# Patient Record
Sex: Female | Born: 1965 | Race: Black or African American | Hispanic: No | Marital: Single | Smoking: Never smoker
Health system: Southern US, Community
[De-identification: ages and names within clinical notes are randomized; demographics above are authoritative.]

## PROBLEM LIST (undated history)

## (undated) DIAGNOSIS — J45909 Unspecified asthma, uncomplicated: Secondary | ICD-10-CM

## (undated) DIAGNOSIS — M1990 Unspecified osteoarthritis, unspecified site: Secondary | ICD-10-CM

## (undated) DIAGNOSIS — E119 Type 2 diabetes mellitus without complications: Secondary | ICD-10-CM

## (undated) DIAGNOSIS — K219 Gastro-esophageal reflux disease without esophagitis: Secondary | ICD-10-CM

## (undated) DIAGNOSIS — T1491XA Suicide attempt, initial encounter: Secondary | ICD-10-CM

## (undated) DIAGNOSIS — I10 Essential (primary) hypertension: Secondary | ICD-10-CM

## (undated) HISTORY — PX: CHOLECYSTECTOMY: SHX55

---

## 1998-07-24 ENCOUNTER — Ambulatory Visit: Admission: RE | Admit: 1998-07-24 | Discharge: 1998-07-24 | Payer: Self-pay

## 1999-11-27 ENCOUNTER — Encounter: Admission: RE | Admit: 1999-11-27 | Discharge: 1999-11-27 | Payer: Self-pay | Admitting: Internal Medicine

## 2000-01-12 ENCOUNTER — Encounter: Admission: RE | Admit: 2000-01-12 | Discharge: 2000-01-12 | Payer: Self-pay | Admitting: Internal Medicine

## 2000-02-12 ENCOUNTER — Encounter: Admission: RE | Admit: 2000-02-12 | Discharge: 2000-05-12 | Payer: Self-pay | Admitting: Internal Medicine

## 2000-03-28 ENCOUNTER — Encounter: Admission: RE | Admit: 2000-03-28 | Discharge: 2000-03-28 | Payer: Self-pay | Admitting: Hematology and Oncology

## 2000-04-05 ENCOUNTER — Encounter: Admission: RE | Admit: 2000-04-05 | Discharge: 2000-04-05 | Payer: Self-pay | Admitting: Internal Medicine

## 2000-08-08 ENCOUNTER — Encounter: Admission: RE | Admit: 2000-08-08 | Discharge: 2000-08-08 | Payer: Self-pay | Admitting: Hematology and Oncology

## 2001-01-16 ENCOUNTER — Encounter: Admission: RE | Admit: 2001-01-16 | Discharge: 2001-01-16 | Payer: Self-pay | Admitting: Hematology and Oncology

## 2001-02-13 ENCOUNTER — Ambulatory Visit (HOSPITAL_COMMUNITY): Admission: RE | Admit: 2001-02-13 | Discharge: 2001-02-13 | Payer: Self-pay | Admitting: Internal Medicine

## 2001-02-13 ENCOUNTER — Encounter: Admission: RE | Admit: 2001-02-13 | Discharge: 2001-02-13 | Payer: Self-pay | Admitting: Internal Medicine

## 2001-02-25 ENCOUNTER — Encounter: Admission: RE | Admit: 2001-02-25 | Discharge: 2001-02-25 | Payer: Self-pay | Admitting: Obstetrics & Gynecology

## 2001-02-25 ENCOUNTER — Other Ambulatory Visit: Admission: RE | Admit: 2001-02-25 | Discharge: 2001-02-25 | Payer: Self-pay | Admitting: Obstetrics & Gynecology

## 2001-05-23 ENCOUNTER — Encounter: Admission: RE | Admit: 2001-05-23 | Discharge: 2001-05-23 | Payer: Self-pay | Admitting: Internal Medicine

## 2001-08-06 ENCOUNTER — Encounter: Admission: RE | Admit: 2001-08-06 | Discharge: 2001-08-06 | Payer: Self-pay | Admitting: Internal Medicine

## 2001-09-11 ENCOUNTER — Encounter: Admission: RE | Admit: 2001-09-11 | Discharge: 2001-09-11 | Payer: Self-pay

## 2002-04-21 ENCOUNTER — Encounter: Admission: RE | Admit: 2002-04-21 | Discharge: 2002-04-21 | Payer: Self-pay | Admitting: Internal Medicine

## 2002-11-16 ENCOUNTER — Encounter: Admission: RE | Admit: 2002-11-16 | Discharge: 2002-11-16 | Payer: Self-pay | Admitting: Internal Medicine

## 2003-03-26 ENCOUNTER — Encounter: Admission: RE | Admit: 2003-03-26 | Discharge: 2003-03-26 | Payer: Self-pay | Admitting: Internal Medicine

## 2003-03-30 ENCOUNTER — Encounter: Admission: RE | Admit: 2003-03-30 | Discharge: 2003-03-30 | Payer: Self-pay | Admitting: Internal Medicine

## 2003-05-14 ENCOUNTER — Emergency Department (HOSPITAL_COMMUNITY): Admission: AD | Admit: 2003-05-14 | Discharge: 2003-05-15 | Payer: Self-pay

## 2008-01-19 ENCOUNTER — Inpatient Hospital Stay (HOSPITAL_COMMUNITY): Admission: EM | Admit: 2008-01-19 | Discharge: 2008-01-28 | Payer: Self-pay | Admitting: Emergency Medicine

## 2008-01-22 ENCOUNTER — Encounter (INDEPENDENT_AMBULATORY_CARE_PROVIDER_SITE_OTHER): Payer: Self-pay | Admitting: Orthopedic Surgery

## 2011-01-23 NOTE — Op Note (Signed)
NAMECECILI, [name]               ACCOUNT NO.:  [id number]   MEDICAL RECORD NO.:  [id number]          PATIENT TYPE:  INP   LOCATION:  5122                         FACILITY:  [facility]   PHYSICIAN:  Metta Clines. Supple, M.D.  DATE OF BIRTH:  1966-01-15   DATE OF PROCEDURE:  DATE OF DISCHARGE:                               OPERATIVE REPORT   PREOPERATIVE DIAGNOSES:  Left midfoot abscess with fifth metatarsal  osteomyelitis.   POSTOPERATIVE DIAGNOSES:  1. Left midfoot abscess with fifth metatarsal osteomyelitis.  2. Proximal extension of abscess along the peroneal tendon sheath into      the lower leg.   PROCEDURE:  1. Left foot incision and drainage with extension proximally into the      lower leg laterally decompressing the peroneal tendon sheath.  2. Fifth ray amputation.   SURGEON:  Metta Clines. Supple, MD   ASSISTANT:  Reather Laurence. Shuford, PA-C   ANESTHESIA:  LMA general.   TOURNIQUET TIME:  None was used.   ESTIMATED BLOOD LOSS:  300 mL.   SPECIMENS:  Fluid was obtained from the midfoot abscess and sent for  routine culture and sensitivity, both aerobic and anaerobic; also sent  from the more proximal abscess, which was subcutaneous in the lateral  aspect of the lower leg, again aerobic and anaerobic.  We also sent a  specimen of the fifth metatarsal for culture and then the remaining  amputated part was sent for routine pathology.   HISTORY:  [name] is a 45 year old insulin-dependent diabetic who has  been followed for a nonhealing wound in her left foot and underwent an  initial I&D by podiatrist down in [facility] and had a VAC wound closure  system applied.  She is seen by her primary physician down in [place]  and due to deterioration of the wound, evidence for purulent  accumulation and foul-smelling discharge, she was subsequently referred  to the [facility] Emergency Room, where she was admitted by the  [facility].  Her foot was found to be severely swollen  and  erythematous with continued purulent drainage.  The patient became  septic and orthopedic consultation was obtained.  She was found to have  a markedly swollen erythematous foot with a transverse ulceration over  the midfoot laterally, and more proximally a sinus tract with frank  purulent drainage, and extremely foul smell.  Compression of the ankle  caused abundant purulent material to exit through the wound distally.  [name] is brought to the operating at this time for treatment of her  acute subcutaneous abscess, which is apparent to progress proximally,  and now is becoming limb threatening.   Preoperatively, I counseled [name] on treatment options as well as  risks versus benefits thereof.  Possible surgical complications  bleeding, infection, neurovascular injury, persistent pain, and possible  need for additional surgery including potential for amputation at a  higher level were all reviewed.  She understands, accepts, and agrees.   PROCEDURE IN DETAIL:  After undergoing routine preop evaluation, the  patient was brought to the operating room, placed supine on  operating  table, and underwent smooth induction of an LMA general anesthesia.  The  left lower extremity was then sterilely prepped and draped in the  midcalf distally.  The initial ulceration over the lateral aspect of the  midfoot was approximately 3 x 6 cm, then there is a more proximal sinus  tract.  A lateral incision was then made longitudinally following the  course of the fifth metatarsal towards the base of the fifth toe, and  initially extending to the level of the lateral malleolus.  Sharp  dissection carried deeply and this did encounter a large subcutaneous  abscess, which dissected both dorsally as well as plantarly.  Sharp  dissection was used to debride the necrotic soft tissue at the margins  of the previously noted ulcer as well as around the sinus tract.  The  depth of the sinus tract was  also completely excised.  Digital  exploration was used to open up the abscess cavities, and we did find  there was abundant purulent material descending from more proximally.  With the finding of purulence into the lateral aspect of the ankle,  incision was then carried proximally over the fibula, and then  posteriorly along the course of the peroneal musculature, and dissection  carried deeply into the subcutaneous tissue.  Following the course of  peroneal tendon sheath, we did encounter a purulent material at this  level, and this was completely debrided.  All loculations were broken up  and digital exploration was used to confirm that there were no more  proximal accumulations of purulent material.  We aggressively milked the  calf to make sure there is no further purulent material more proximally.  We completely explored all the compartments about the lateral ankle.  We  then dissected deeply.  The previous MRI scan and radiographs had showed  evidence for significant bony overgrowth and deformity of the fifth  metatarsal, which was felt to likely represent chronic osteomyelitis.  With these findings, we proceeded with a fifth ray amputation.  Sharp  dissection was used to divide the fifth metatarsal from adjacent fourth  metatarsal on the inner metatarsal ligaments, and dissected the more  proximal tendinous attachments, and also then performed an elliptical  excision of the fifth toe.  This allowed exposure of healthy-appearing  tissue surrounding the amputated ray.  This wound was then meticulously  cleaned and we used pulsatile lavage to irrigate and debride the entire  wound.  We then used a combination of pressure and electrocautery to  obtain hemostasis.  Of note, there was a brisk bleeding from all cut  surfaces.  We obtained final hemostasis and then did a wound packing  with a moist Kerlix for the wound, wrapped with ABDs, 4x4s, dry Kerlix,  and an Ace bandage, creating a  pressure dressing.  The patient was then  extubated and taken to the recovery room in stable condition.      Metta Clines. Supple, M.D.  Electronically Signed     KMS/MEDQ  D:  01/22/2008  T:  01/23/2008  Job:  [id number]

## 2011-01-23 NOTE — H&P (Signed)
NAMEBAILLEY, [name] NO.:  [id number]   MEDICAL RECORD NO.:  [id number]           PATIENT TYPE:   LOCATION:                                 FACILITY:   PHYSICIAN:  [name], M.D. DATE OF BIRTH:  [birth year]   DATE OF ADMISSION:  01/19/2008  DATE OF DISCHARGE:                              HISTORY & PHYSICAL   The patient primary care doctor is unassigned.   CHIEF COMPLAINTS:  Left foot swelling.   HISTORY OF PRESENT ILLNESS:  [name] is a 86-year female with past  medical history of diabetes mellitus who states that she developed a  left foot wound on the plantar surface with subsequent I&D back in December 11, 2007.  A wound V.A.C. was placed.  She has been receiving IV  vancomycin and has a visiting nurse.  Her visiting nurse came out today  and stated that her left leg appeared to be hot and swollen.  Her  primary care doctor was called, and she was told to come to the hospital  for evaluation.  The patient indicates that she has chronic pain in that  leg, and there has been no new changes with regards to the pain level.  She also complains of some subjective fevers and chills but no nausea or  vomiting.   PAST MEDICAL HISTORY:  1. Diabetes mellitus.  2. Asthma.  3. Hypothyroidism.  4. Depression.  5. Anxiety.  6. Neuropathy.   PAST SURGICAL HISTORY:  Left plantar wound I&D January 03, 2008.   ALLERGIES:  No known drug allergies.   HOME MEDICATIONS:  1. Lantus insulin 65 units b.i.d.  2. Apidra 35 units t.i.d. before each meal.  3. Synthroid 10 mg daily.  4. Cymbalta 60 mg daily.  5. Lisinopril 20 mg daily.   SOCIAL HISTORY:  Cigarettes:  The patient denies.  Alcohol:  The patient  denies.   FAMILY HISTORY:  Mother has diabetes.  Father also diabetes.   REVIEW OF SYSTEMS:  As per HPI.   PHYSICAL EXAMINATION:  GENERAL:  The patient is awake.  She is in no  obvious respiratory distress.  VITALS:  Temperature is 98.1, blood pressure  96/58, heart rate 96,  respirations 20, O2 saturation 97%.  HEENT:  Normocephalic, atraumatic.  Anicteric.  Extraocular movements  are intact.  Oral mucosa is pink.  No thrush.  No exudates.  NECK:  Supple.  No lymphadenopathy.  No thyromegaly.  CARDIAC:  S1 and S2 present.  Regular rate and rhythm.  RESPIRATORY:  No crackles or wheezes.  ABDOMEN:  Soft, nontender, nondistended.  Positive bowel sounds x4  quadrants.  No masses palpated.  EXTREMITIES:  The left foot has a large wound on the plantar aspect of  it.  There is a wound V.A.C. attached to the wound.  NEUROLOGICAL:  The patient is alert and oriented x3.  MUSCULOSKELETAL:  5/5 upper and lower extremity strength.   Sodium is 133, potassium 3.5, chloride 99, CO2 24, glucose 135, BUN 10,  creatinine 1.01, calcium 8.6.  White blood cell count 16.6, hemoglobin  7.5, hematocrit  22.4, platelet count 371.  X-rays the left foot reveals  a deformity of the left fifth metatarsal with sclerosis in the fourth  and fifth metatarsals likely chronic osteomyelitis, overlying soft  tissue swelling and soft tissue defect.   ASSESSMENT:  1. Left plantar foot wound with infection.  Will check blood cultures      x2 as well as wound cultures.  Will start the patient on      antibiotics.  Results of the x-ray have already been viewed.  It      revealed probable chronic osteomyelitis.  Will consider ordering an      MRI of the patient's foot to rule out any acute sign of      osteomyelitis.  We will also attempt to obtain prior medical      records.  Will consult the wound care nurse for further evaluation      and consider consultation with orthopedic surgery.  2. History of diabetes mellitus.  Will resume the patient's prior      diabetic medications as well as Accu-Chek  3. Anemia.  Source of the patient's anemia is questionable.  Will      check stools as well as iron studies.  4. History of hypothyroidism.  Will resume the patient's  Synthroid.  5. Gastrointestinal prophylaxis, will provide Protonix.  6. Deep vein thrombosis prophylaxis, will provide Lovenox      [name], M.D.  Electronically Signed     OR/MEDQ  D:  01/19/2008  T:  01/19/2008  Job:  [id number]

## 2011-01-23 NOTE — Discharge Summary (Signed)
NAMEBERTHINE, Smith               ACCOUNT NO.:  [id number]   MEDICAL RECORD NO.:  [id number]          PATIENT TYPE:  INP   LOCATION:  5122                         FACILITY:  [facility]   PHYSICIAN:  Cherene Altes, M.D.DATE OF BIRTH:  1965/10/31   DATE OF ADMISSION:  01/19/2008  DATE OF DISCHARGE:  01/28/2008                               DISCHARGE SUMMARY   PRIMARY CARE PHYSICIAN:  Unassigned - physician in [place].   DISCHARGE DIAGNOSES:  1. Severe polymicrobial left foot wound.      a.     Status post I and D x2 during her hospital stay.      b.     Ongoing outpatient VAC therapy to be continued.      c.     High probability of recurrent wound infection.      d.     Possible below-the-knee amputation to be required if wound       does not improve significantly.      e.     Ongoing outpatient IV antibiotic therapy.      f.     Outpatient wound followup established in the patient's home       town and home wound care established in home town.  2. Diabetes mellitus.  3. Morbid obesity.  4. Chronic kidney disease with baseline creatinine approximately 1.9-      2.9.  5. Hypothyroidism.  6. Anemia - combination chronic kidney disease plus chronic      inflammation from wound.  7. Staphylococcus aureus bacteremia - methicillin susceptible -      completed course of IV vancomycin.  8. Depression.  9. Anxiety disorder.  10.Severe diabetic neuropathy bilateral lower extremities.   DISCHARGE MEDICATIONS:  1. Invanz 1 gram IV per PICC line daily until discontinued by the      patient's primary care physician.  2. Lantus insulin 16 units subcu b.i.d.  3. Glipizide/metformin 5/500 two tablets p.o. b.i.d.  4. Clonazepam 1 mg b.i.d.  5. Lisinopril 10 mg p.o. daily.  6. Levothyroxine 150 mcg p.o. daily.  7. Cymbalta 60 mg p.o. b.i.d.   CONSULTATIONS:  Dr. Justice Britain with orthopedic surgery.   PROCEDURES:  1. MRI of lower extremity Jan 20, 2008, mid shaft fracture fifth  metatarsal with chronic healing response and abnormal edema and      enhancement in the fifth metatarsal compatible with osteomyelitis.      Considerable surrounding soft tissue edema is present.  Low level      enhancement lateral cuboid and calcaneus may also represent      osteomyelitis.  Cortical thinning in the base of the fourth      metatarsal could be a manifestation of osteomyelitis although no      overt edema in the shaft of the fourth metatarsal was identified.      Needle shaped metallic foreign body is present along the medial      corner of the plantar fascia with thickening of the plantar fascia      just distal to this foreign body.  Extensive muscular and  subcutaneous edema in the foot compatible with myositis and      cellulitis.  Considerable degenerative tarsometatarsal spurring and      degenerative arthropathy.  2. Left foot incision and drainage with extension proximally in the      left lateral into the lower leg laterally decompressing the      peroneal tendon sheath.  Fifth ray amputation.  3. Repeat I and D.   FOLLOWUP:  The patient is well established with followup in .  She is advised to continue to follow up as follows:  A.  The patient is advised to follow up with her podiatrist who was  previously following her foot wound on Friday of this week.  B.  The patient is advised to continue with her home health wound VAC  therapy nurse to attend to her wound care needs.  Arrangements are being  made for this by social worker prior to the patient's discharge.  C.  Home IV antibiotics via the PICC line are advised to be continued.  D.  The patient is advised to follow up with her primary care physician  within 3-4 days to assure that her CBG is extremely well controlled.   HOSPITAL COURSE:  [name] is a very pleasant 45 year old  morbidly obese uncontrolled diabetic who had been dealing with a left  foot wound for quite some time in the  outpatient setting.  This had been  managed at home in her home town through what appears to have been of a  podiatry consultation as well as home VAC therapy with IV antibiotics.  Unfortunately the patient's wound was not progressing well and after  visiting with home health nurse the patient was advised to present to  [facility] emergency room for evaluation.  The patient was admitted to  acute unit.  Cultures were sent.  MRI was obtained and did suggest the  probability of cellulitis.  Orthopedic consultation was carried out.  Empiric antibiotics were administered.  No group A strep or Staph aureus  were able to be isolated from the wound itself.  Incidentally, Staph  aureus which was essentially pansensitive was localized within the  patient's blood on a solitary blood culture from Jan 19, 2008.  The  patient was treated for this with ongoing vancomycin therapy.  Otherwise, rare Proteus mirabilis was the only pathogen actually  identified on the patient's wound cultures.  This was felt to most  likely be secondary to the fact that the patient's wound was  polymicrobial.  This was primarily pansensitive with the exception to  trimethoprim sulfamethoxazole as well as ampicillin.  Actually, on those  blood cultures it was 1 out of 2 total drawn.  With ongoing IV  antibiotic therapy the patient was taken to the operating room for an  incision and drainage.  Initially the patient underwent a fifth ray  amputation.  After reinspecting the wound the patient was taken back to  the OR for a second I and D.  Wound VAC was then applied.  Adjustments  were made in the patient's diabetic regimen to assure that her CBG  remained well controlled.  Blood was transfused due to the surgical loss  and the baseline of chronic kidney disease.  The patient otherwise  remained stable during her hospitalization.   On Jan 28, 2008, the patient was cleared medically for discharge home.  The wound is noted to  be quite extensive.  At the present time there is  granulation on the edges of the wound but the base of the wound tracked  back deep into the plantar fascia area onto the plantar aspect of the  foot.  There is significant concern that this wound could spread and  that the patient could ultimately require a below-the-knee amputation.  The serious nature of this wound is conveyed directly to the patient.  The absolute need for very close care is conveyed to the patient.  The  patient assures this dictator that she had arrangements for home health  VAC and IV antibiotic therapy.  She also further  assures this dictator that she has a podiatrist who will continue to  evaluate her foot in the outpatient setting.  With these arrangements in  place the patient is therefore cleared for discharge on Jan 28, 2008, on  the above listed medical regimen.      Cherene Altes, M.D.  Electronically Signed     JTM/MEDQ  D:  01/28/2008  T:  01/28/2008  Job:  [id number]

## 2011-01-23 NOTE — Op Note (Signed)
[name], [name]               ACCOUNT NO.:  [id number]   MEDICAL RECORD NO.:  [id number]          PATIENT TYPE:  INP   LOCATION:  5122                         FACILITY:  [facility]   PHYSICIAN:  Metta Clines. Supple, M.D.  DATE OF BIRTH:  Jun 17, 1966   DATE OF PROCEDURE:  01/23/2008  DATE OF DISCHARGE:                               OPERATIVE REPORT   PREOPERATIVE DIAGNOSIS:  Left foot diabetic abscess.   POSTOPERATIVE DIAGNOSIS:  Left foot diabetic abscess.   PROCEDURES:  Exploration, irrigation, and debridement of the left foot  diabetic abscess.   SURGEON:  Metta Clines. Supple, M.D.   Terrence DupontOlivia Mackie A. Shuford, P.A.-C.   ANESTHESIA:  General endotracheal.   ESTIMATED BLOOD LOSS:  Minimal.   DRAINS:  None.   HISTORY:  [name] is a 45 year old female who was taken to the  operating room yesterday for urgent debridement of a left foot lateral  plantar diabetic abscess and was brought back to the operating room  today for repeat I&D with possible placement of a vacuum-assisted  closure device.   Preoperatively, counseled [name] of treatment options as well as  risks versus benefits thereof.  Possible surgical complications with  infection or vascular injury, as well as, likely need for additional  surgery, potential need for amputation are reviewed.  The patient  understands and accepts and agrees with the plan.   PROCEDURE IN DETAIL:  After adequate routine preop evaluation, the  patient was brought to the operating room and placed supine on the  operating table and underwent induction of the general endotracheal  anesthesia.  She is on an on-going course of antibiotics.  Dressings  were removed from the left leg and left lower extremity was then  sterilely prepped and draped in standard fashion.  The wound extends  from the base of the second toe to the ankle, then proximally up into  the calf, additional 20 cm.  The entire wound was debrided with a  rongeur of any  necrotic tissue regions.  We did find over the plantar  aspect of the midfoot laterally, there was a collection of purulent  material and necrotic tissue, and this area was sharply excised away and  then a rongeur was used to further debride this region.  In addition,  there appeared to be some nonviable-appearing tissue into the plantar  fat pad of the heel and these areas were all opened and debrided and  explored and subsequently irrigated.  We did not see any evidence for  purulence tracking proximally or on the dorsum of the foot.  At this  point, after the wound was debrided to our  satisfaction and all surfaces appeared viable, we then used pulsatile  lavage to meticulously clean the wound.  We placed a wet-to-dry dressing  with damp Kerlix packed in the wound and multiple ABDs, Kerlix, and Ace  wrap.  The patient was then awakened, extubated, and taken to the  recovery room in stable condition.      Metta Clines. Supple, M.D.  Electronically Signed     KMS/MEDQ  D:  01/23/2008  T:  01/24/2008  Job:  [id number]

## 2011-06-06 LAB — CBC
HCT: 26.7 — ABNORMAL LOW
HCT: 28.9 — ABNORMAL LOW
Hemoglobin: 10.2 — ABNORMAL LOW
Hemoglobin: 8.1 — ABNORMAL LOW
MCHC: 34.2
MCHC: 34.6
MCHC: 35
MCV: 78.9
MCV: 80.6
MCV: 81
MCV: 81.3
MCV: 81.7
Platelets: 426 — ABNORMAL HIGH
Platelets: 449 — ABNORMAL HIGH
Platelets: 453 — ABNORMAL HIGH
Platelets: 486 — ABNORMAL HIGH
RBC: 2.91 — ABNORMAL LOW
RBC: 3.36 — ABNORMAL LOW
RBC: 3.41 — ABNORMAL LOW
RBC: 3.64 — ABNORMAL LOW
RDW: 17.6 — ABNORMAL HIGH
RDW: 17.9 — ABNORMAL HIGH
RDW: 18.2 — ABNORMAL HIGH
RDW: 18.9 — ABNORMAL HIGH
WBC: 13.3 — ABNORMAL HIGH
WBC: 14.4 — ABNORMAL HIGH
WBC: 15 — ABNORMAL HIGH
WBC: 15.1 — ABNORMAL HIGH

## 2011-06-06 LAB — BASIC METABOLIC PANEL
BUN: 11
BUN: 11
BUN: 7
BUN: 9
CO2: 21
CO2: 21
Calcium: 8 — ABNORMAL LOW
Calcium: 8.4
Calcium: 8.6
Calcium: 8.6
Chloride: 102
Chloride: 106
Creatinine, Ser: 1.91 — ABNORMAL HIGH
Creatinine, Ser: 1.96 — ABNORMAL HIGH
Creatinine, Ser: 2.39 — ABNORMAL HIGH
GFR calc Af Amer: 34 — ABNORMAL LOW
GFR calc Af Amer: 35 — ABNORMAL LOW
GFR calc non Af Amer: 28 — ABNORMAL LOW
GFR calc non Af Amer: 28 — ABNORMAL LOW
Glucose, Bld: 121 — ABNORMAL HIGH
Glucose, Bld: 270 — ABNORMAL HIGH
Glucose, Bld: 69 — ABNORMAL LOW
Potassium: 4.8
Sodium: 138

## 2011-06-06 LAB — GLUCOSE, RANDOM: Glucose, Bld: 310 — ABNORMAL HIGH

## 2011-06-06 LAB — METHYLMALONIC ACID(MMA), RND URINE: Methylmalonic Acid, Ur: 0.7 umol/L

## 2011-06-06 LAB — VANCOMYCIN, TROUGH: Vancomycin Tr: 36.4

## 2011-06-06 LAB — VANCOMYCIN, RANDOM: Vancomycin Rm: 24.5

## 2016-04-01 DIAGNOSIS — E875 Hyperkalemia: Secondary | ICD-10-CM

## 2016-04-01 DIAGNOSIS — R079 Chest pain, unspecified: Secondary | ICD-10-CM

## 2016-04-01 DIAGNOSIS — E1169 Type 2 diabetes mellitus with other specified complication: Secondary | ICD-10-CM

## 2016-04-01 DIAGNOSIS — N390 Urinary tract infection, site not specified: Secondary | ICD-10-CM

## 2016-04-01 DIAGNOSIS — R45851 Suicidal ideations: Secondary | ICD-10-CM

## 2016-04-01 DIAGNOSIS — I160 Hypertensive urgency: Secondary | ICD-10-CM

## 2016-04-01 DIAGNOSIS — Z8709 Personal history of other diseases of the respiratory system: Secondary | ICD-10-CM

## 2016-04-01 DIAGNOSIS — N179 Acute kidney failure, unspecified: Secondary | ICD-10-CM

## 2016-04-04 DIAGNOSIS — N390 Urinary tract infection, site not specified: Secondary | ICD-10-CM

## 2016-04-04 DIAGNOSIS — E1169 Type 2 diabetes mellitus with other specified complication: Secondary | ICD-10-CM

## 2016-04-04 DIAGNOSIS — I160 Hypertensive urgency: Secondary | ICD-10-CM

## 2016-04-04 DIAGNOSIS — E875 Hyperkalemia: Secondary | ICD-10-CM

## 2016-04-04 DIAGNOSIS — R45851 Suicidal ideations: Secondary | ICD-10-CM

## 2016-04-04 DIAGNOSIS — R079 Chest pain, unspecified: Secondary | ICD-10-CM

## 2016-04-04 DIAGNOSIS — N179 Acute kidney failure, unspecified: Secondary | ICD-10-CM

## 2016-04-04 DIAGNOSIS — Z8709 Personal history of other diseases of the respiratory system: Secondary | ICD-10-CM

## 2016-04-05 DIAGNOSIS — I10 Essential (primary) hypertension: Secondary | ICD-10-CM

## 2016-04-11 ENCOUNTER — Encounter (HOSPITAL_COMMUNITY): Payer: Self-pay | Admitting: *Deleted

## 2016-04-11 ENCOUNTER — Inpatient Hospital Stay (HOSPITAL_COMMUNITY)
Admission: AD | Admit: 2016-04-11 | Discharge: 2016-04-25 | DRG: 683 | Disposition: A | Discharge status: Skilled Nursing Facility | Payer: Medicaid Other | Source: Other Acute Inpatient Hospital | Attending: Internal Medicine | Admitting: Internal Medicine

## 2016-04-11 DIAGNOSIS — E1122 Type 2 diabetes mellitus with diabetic chronic kidney disease: Secondary | ICD-10-CM | POA: Diagnosis present

## 2016-04-11 DIAGNOSIS — E872 Acidosis, unspecified: Secondary | ICD-10-CM

## 2016-04-11 DIAGNOSIS — Z833 Family history of diabetes mellitus: Secondary | ICD-10-CM

## 2016-04-11 DIAGNOSIS — Z89512 Acquired absence of left leg below knee: Secondary | ICD-10-CM | POA: Diagnosis not present

## 2016-04-11 DIAGNOSIS — Z9114 Patient's other noncompliance with medication regimen: Secondary | ICD-10-CM

## 2016-04-11 DIAGNOSIS — Z590 Homelessness: Secondary | ICD-10-CM

## 2016-04-11 DIAGNOSIS — N184 Chronic kidney disease, stage 4 (severe): Secondary | ICD-10-CM

## 2016-04-11 DIAGNOSIS — Z9119 Patient's noncompliance with other medical treatment and regimen: Secondary | ICD-10-CM

## 2016-04-11 DIAGNOSIS — N179 Acute kidney failure, unspecified: Secondary | ICD-10-CM | POA: Diagnosis not present

## 2016-04-11 DIAGNOSIS — F419 Anxiety disorder, unspecified: Secondary | ICD-10-CM | POA: Diagnosis present

## 2016-04-11 DIAGNOSIS — E1151 Type 2 diabetes mellitus with diabetic peripheral angiopathy without gangrene: Secondary | ICD-10-CM | POA: Diagnosis present

## 2016-04-11 DIAGNOSIS — N189 Chronic kidney disease, unspecified: Secondary | ICD-10-CM

## 2016-04-11 DIAGNOSIS — I10 Essential (primary) hypertension: Secondary | ICD-10-CM | POA: Diagnosis present

## 2016-04-11 DIAGNOSIS — I129 Hypertensive chronic kidney disease with stage 1 through stage 4 chronic kidney disease, or unspecified chronic kidney disease: Secondary | ICD-10-CM | POA: Diagnosis present

## 2016-04-11 DIAGNOSIS — E1140 Type 2 diabetes mellitus with diabetic neuropathy, unspecified: Secondary | ICD-10-CM | POA: Diagnosis present

## 2016-04-11 DIAGNOSIS — E875 Hyperkalemia: Secondary | ICD-10-CM

## 2016-04-11 DIAGNOSIS — Z515 Encounter for palliative care: Secondary | ICD-10-CM | POA: Diagnosis not present

## 2016-04-11 DIAGNOSIS — F319 Bipolar disorder, unspecified: Secondary | ICD-10-CM | POA: Diagnosis present

## 2016-04-11 DIAGNOSIS — R079 Chest pain, unspecified: Secondary | ICD-10-CM

## 2016-04-11 DIAGNOSIS — Z9049 Acquired absence of other specified parts of digestive tract: Secondary | ICD-10-CM

## 2016-04-11 DIAGNOSIS — E869 Volume depletion, unspecified: Secondary | ICD-10-CM | POA: Diagnosis present

## 2016-04-11 DIAGNOSIS — R45851 Suicidal ideations: Secondary | ICD-10-CM | POA: Diagnosis present

## 2016-04-11 DIAGNOSIS — F4325 Adjustment disorder with mixed disturbance of emotions and conduct: Secondary | ICD-10-CM | POA: Diagnosis not present

## 2016-04-11 DIAGNOSIS — Z8249 Family history of ischemic heart disease and other diseases of the circulatory system: Secondary | ICD-10-CM

## 2016-04-11 DIAGNOSIS — Z841 Family history of disorders of kidney and ureter: Secondary | ICD-10-CM | POA: Diagnosis not present

## 2016-04-11 DIAGNOSIS — E1169 Type 2 diabetes mellitus with other specified complication: Secondary | ICD-10-CM

## 2016-04-11 DIAGNOSIS — Z8709 Personal history of other diseases of the respiratory system: Secondary | ICD-10-CM

## 2016-04-11 DIAGNOSIS — G629 Polyneuropathy, unspecified: Secondary | ICD-10-CM

## 2016-04-11 DIAGNOSIS — N390 Urinary tract infection, site not specified: Secondary | ICD-10-CM

## 2016-04-11 DIAGNOSIS — I160 Hypertensive urgency: Secondary | ICD-10-CM | POA: Diagnosis not present

## 2016-04-11 DIAGNOSIS — F329 Major depressive disorder, single episode, unspecified: Secondary | ICD-10-CM | POA: Diagnosis present

## 2016-04-11 DIAGNOSIS — Z7189 Other specified counseling: Secondary | ICD-10-CM | POA: Diagnosis not present

## 2016-04-11 DIAGNOSIS — Z89511 Acquired absence of right leg below knee: Secondary | ICD-10-CM | POA: Diagnosis not present

## 2016-04-11 DIAGNOSIS — F32A Depression, unspecified: Secondary | ICD-10-CM | POA: Diagnosis present

## 2016-04-11 DIAGNOSIS — E876 Hypokalemia: Secondary | ICD-10-CM | POA: Diagnosis not present

## 2016-04-11 DIAGNOSIS — Z6841 Body Mass Index (BMI) 40.0 and over, adult: Secondary | ICD-10-CM | POA: Diagnosis not present

## 2016-04-11 DIAGNOSIS — R45850 Homicidal ideations: Secondary | ICD-10-CM

## 2016-04-11 DIAGNOSIS — K219 Gastro-esophageal reflux disease without esophagitis: Secondary | ICD-10-CM | POA: Diagnosis present

## 2016-04-11 HISTORY — DX: Essential (primary) hypertension: I10

## 2016-04-11 HISTORY — DX: Unspecified osteoarthritis, unspecified site: M19.90

## 2016-04-11 HISTORY — DX: Gastro-esophageal reflux disease without esophagitis: K21.9

## 2016-04-11 HISTORY — DX: Type 2 diabetes mellitus without complications: E11.9

## 2016-04-11 HISTORY — DX: Suicide attempt, initial encounter: T14.91XA

## 2016-04-11 HISTORY — DX: Unspecified asthma, uncomplicated: J45.909

## 2016-04-11 LAB — CBC
HCT: 27.2 % — ABNORMAL LOW (ref 36.0–46.0)
HEMOGLOBIN: 8.6 g/dL — AB (ref 12.0–15.0)
MCH: 26.2 pg (ref 26.0–34.0)
MCHC: 31.6 g/dL (ref 30.0–36.0)
MCV: 82.9 fL (ref 78.0–100.0)
Platelets: 317 10*3/uL (ref 150–400)
RBC: 3.28 MIL/uL — AB (ref 3.87–5.11)
RDW: 15.3 % (ref 11.5–15.5)
WBC: 6.6 10*3/uL (ref 4.0–10.5)

## 2016-04-11 LAB — GLUCOSE, CAPILLARY
GLUCOSE-CAPILLARY: 147 mg/dL — AB (ref 65–99)
Glucose-Capillary: 239 mg/dL — ABNORMAL HIGH (ref 65–99)

## 2016-04-11 LAB — MRSA PCR SCREENING: MRSA BY PCR: NEGATIVE

## 2016-04-11 LAB — BASIC METABOLIC PANEL
ANION GAP: 7 (ref 5–15)
BUN: 53 mg/dL — ABNORMAL HIGH (ref 6–20)
CHLORIDE: 106 mmol/L (ref 101–111)
CO2: 20 mmol/L — ABNORMAL LOW (ref 22–32)
Calcium: 8.3 mg/dL — ABNORMAL LOW (ref 8.9–10.3)
Creatinine, Ser: 3.68 mg/dL — ABNORMAL HIGH (ref 0.44–1.00)
GFR, EST AFRICAN AMERICAN: 16 mL/min — AB (ref >60)
GFR, EST NON AFRICAN AMERICAN: 13 mL/min — AB (ref >60)
Glucose, Bld: 184 mg/dL — ABNORMAL HIGH (ref 65–99)
POTASSIUM: 5.2 mmol/L — AB (ref 3.5–5.1)
SODIUM: 133 mmol/L — AB (ref 135–145)

## 2016-04-11 MED ORDER — SODIUM CHLORIDE 0.9% FLUSH
3.0000 mL | Freq: Two times a day (BID) | INTRAVENOUS | Status: DC
Start: 2016-04-11 — End: 2016-04-11

## 2016-04-11 MED ORDER — ALBUTEROL SULFATE (2.5 MG/3ML) 0.083% IN NEBU
2.5000 mg | INHALATION_SOLUTION | RESPIRATORY_TRACT | Status: DC | PRN
  Administered 2016-04-20: 2.5 mg via RESPIRATORY_TRACT
  Filled 2016-04-11: qty 3

## 2016-04-11 MED ORDER — QUETIAPINE FUMARATE 25 MG PO TABS
25.0000 mg | ORAL_TABLET | Freq: Three times a day (TID) | ORAL | Status: DC
  Administered 2016-04-11 – 2016-04-25 (×38): 25 mg via ORAL
  Filled 2016-04-11 (×40): qty 1

## 2016-04-11 MED ORDER — FLUTICASONE PROPIONATE 50 MCG/ACT NA SUSP
1.0000 | Freq: Two times a day (BID) | NASAL | Status: DC
  Administered 2016-04-11 – 2016-04-25 (×26): 1 via NASAL
  Filled 2016-04-11: qty 16

## 2016-04-11 MED ORDER — INSULIN DETEMIR 100 UNIT/ML ~~LOC~~ SOLN
10.0000 [IU] | Freq: Two times a day (BID) | SUBCUTANEOUS | Status: DC
  Administered 2016-04-11 – 2016-04-25 (×28): 10 [IU] via SUBCUTANEOUS
  Filled 2016-04-11 (×29): qty 0.1

## 2016-04-11 MED ORDER — AMLODIPINE BESYLATE 10 MG PO TABS: 10.0000 mg | ORAL_TABLET | Freq: Every day | ORAL | Status: DC

## 2016-04-11 MED ORDER — HYDRALAZINE HCL 20 MG/ML IJ SOLN: 10.0000 mg | Freq: Four times a day (QID) | INTRAMUSCULAR | Status: DC | PRN

## 2016-04-11 MED ORDER — HEPARIN SODIUM (PORCINE) 5000 UNIT/ML IJ SOLN
5000.0000 [IU] | Freq: Three times a day (TID) | INTRAMUSCULAR | Status: DC
  Administered 2016-04-11 – 2016-04-25 (×38): 5000 [IU] via SUBCUTANEOUS
  Filled 2016-04-11 (×39): qty 1

## 2016-04-11 MED ORDER — METOPROLOL TARTRATE 25 MG PO TABS
25.0000 mg | ORAL_TABLET | Freq: Two times a day (BID) | ORAL | Status: DC
Start: 2016-04-11 — End: 2016-04-25
  Administered 2016-04-11 – 2016-04-25 (×27): 25 mg via ORAL
  Filled 2016-04-11 (×28): qty 1

## 2016-04-11 MED ORDER — SODIUM CHLORIDE 0.9% FLUSH: 3.0000 mL | INTRAVENOUS | Status: DC | PRN

## 2016-04-11 MED ORDER — ONDANSETRON HCL 4 MG PO TABS: 4.0000 mg | ORAL_TABLET | Freq: Four times a day (QID) | ORAL | Status: DC | PRN

## 2016-04-11 MED ORDER — ALBUTEROL SULFATE (2.5 MG/3ML) 0.083% IN NEBU: 2.5000 mg | INHALATION_SOLUTION | RESPIRATORY_TRACT | Status: DC | PRN

## 2016-04-11 MED ORDER — HYDRALAZINE HCL 25 MG PO TABS
25.0000 mg | ORAL_TABLET | Freq: Three times a day (TID) | ORAL | Status: DC
  Administered 2016-04-11 – 2016-04-22 (×31): 25 mg via ORAL
  Filled 2016-04-11 (×32): qty 1

## 2016-04-11 MED ORDER — LORATADINE 10 MG PO TABS
10.0000 mg | ORAL_TABLET | Freq: Every day | ORAL | Status: DC
Start: 2016-04-11 — End: 2016-04-25
  Administered 2016-04-11 – 2016-04-25 (×15): 10 mg via ORAL
  Filled 2016-04-11 (×15): qty 1

## 2016-04-11 MED ORDER — ONDANSETRON HCL 4 MG/2ML IJ SOLN: 4.0000 mg | Freq: Four times a day (QID) | INTRAMUSCULAR | Status: DC | PRN

## 2016-04-11 MED ORDER — SODIUM CHLORIDE 0.9% FLUSH
3.0000 mL | Freq: Two times a day (BID) | INTRAVENOUS | Status: DC
  Administered 2016-04-11 – 2016-04-17 (×13): 3 mL via INTRAVENOUS

## 2016-04-11 MED ORDER — OXYCODONE HCL 5 MG PO TABS
5.0000 mg | ORAL_TABLET | ORAL | Status: DC | PRN
  Administered 2016-04-11 – 2016-04-12 (×2): 5 mg via ORAL
  Filled 2016-04-11 (×2): qty 1

## 2016-04-11 MED ORDER — ACETAMINOPHEN 325 MG PO TABS: 650.0000 mg | ORAL_TABLET | Freq: Four times a day (QID) | ORAL | Status: DC | PRN

## 2016-04-11 MED ORDER — ACETAMINOPHEN 325 MG PO TABS
650.0000 mg | ORAL_TABLET | Freq: Four times a day (QID) | ORAL | Status: DC | PRN
  Administered 2016-04-20 – 2016-04-22 (×3): 650 mg via ORAL
  Filled 2016-04-11 (×3): qty 2

## 2016-04-11 MED ORDER — ASPIRIN EC 81 MG PO TBEC
81.0000 mg | DELAYED_RELEASE_TABLET | Freq: Every day | ORAL | Status: DC
  Administered 2016-04-12 – 2016-04-25 (×14): 81 mg via ORAL
  Filled 2016-04-11 (×14): qty 1

## 2016-04-11 MED ORDER — SODIUM CHLORIDE 0.9 % IV SOLN: 250.0000 mL | INTRAVENOUS | Status: DC | PRN

## 2016-04-11 MED ORDER — INSULIN ASPART 100 UNIT/ML ~~LOC~~ SOLN
0.0000 [IU] | Freq: Three times a day (TID) | SUBCUTANEOUS | Status: DC
  Administered 2016-04-11: 5 [IU] via SUBCUTANEOUS
  Administered 2016-04-12 (×2): 2 [IU] via SUBCUTANEOUS
  Administered 2016-04-12 – 2016-04-13 (×2): 3 [IU] via SUBCUTANEOUS
  Administered 2016-04-13: 2 [IU] via SUBCUTANEOUS
  Administered 2016-04-13 – 2016-04-14 (×2): 3 [IU] via SUBCUTANEOUS
  Administered 2016-04-14: 5 [IU] via SUBCUTANEOUS
  Administered 2016-04-14: 2 [IU] via SUBCUTANEOUS
  Administered 2016-04-15 (×2): 3 [IU] via SUBCUTANEOUS
  Administered 2016-04-15: 2 [IU] via SUBCUTANEOUS
  Administered 2016-04-16 – 2016-04-17 (×4): 3 [IU] via SUBCUTANEOUS
  Administered 2016-04-17: 2 [IU] via SUBCUTANEOUS
  Administered 2016-04-17: 3 [IU] via SUBCUTANEOUS
  Administered 2016-04-18: 2 [IU] via SUBCUTANEOUS
  Administered 2016-04-18: 3 [IU] via SUBCUTANEOUS
  Administered 2016-04-19 – 2016-04-21 (×6): 2 [IU] via SUBCUTANEOUS
  Administered 2016-04-22 – 2016-04-23 (×2): 3 [IU] via SUBCUTANEOUS
  Administered 2016-04-24: 2 [IU] via SUBCUTANEOUS
  Administered 2016-04-24: 5 [IU] via SUBCUTANEOUS

## 2016-04-11 MED ORDER — PANTOPRAZOLE SODIUM 40 MG PO TBEC
40.0000 mg | DELAYED_RELEASE_TABLET | Freq: Every day | ORAL | Status: DC
Start: 2016-04-12 — End: 2016-04-25
  Administered 2016-04-12 – 2016-04-25 (×14): 40 mg via ORAL
  Filled 2016-04-11 (×14): qty 1

## 2016-04-11 MED ORDER — FLUOXETINE HCL 20 MG PO TABS
10.0000 mg | ORAL_TABLET | Freq: Every day | ORAL | Status: DC
  Filled 2016-04-11: qty 1

## 2016-04-11 MED ORDER — SODIUM POLYSTYRENE SULFONATE 15 GM/60ML PO SUSP
30.0000 g | Freq: Once | ORAL | Status: AC
  Administered 2016-04-11: 30 g via ORAL
  Filled 2016-04-11: qty 120

## 2016-04-11 MED ORDER — PREGABALIN 25 MG PO CAPS
25.0000 mg | ORAL_CAPSULE | Freq: Every day | ORAL | Status: DC
  Administered 2016-04-11 – 2016-04-24 (×13): 25 mg via ORAL
  Filled 2016-04-11 (×14): qty 1

## 2016-04-11 MED ORDER — ACETAMINOPHEN 650 MG RE SUPP: 650.0000 mg | Freq: Four times a day (QID) | RECTAL | Status: DC | PRN

## 2016-04-11 NOTE — H&P (Signed)
HISTORY AND PHYSICAL       PATIENT DETAILS Name: [name] Age: 50 y.o. Sex: female Date of Birth: [birth year] Admit Date: 04/11/2016 PCP:No primary care provider on file.   CHIEF COMPLAINT:  Transfer from [facility] was psychiatric evaluation  HPI: [name] is a 50 y.o. female with medical history significant of of type 2 diabetes, hypertension, bilateral BKA, and occasional noncompliance prior to this admission, depression/bipolar disorder with prior suicidal attempt who initially was admitted to [facility] on 04/01/16 for evaluation of chest pain during that visit she also claimed that she had suicidal ideation by overdosing on multiple medications. She was subsequently admitted to [facility], cardiac enzymes remained and negative. Cipro course was complicated by development of uncontrolled hypertension for which she was treated with multiple antihypertensives and a UTI for which she required intravenous antibiotics. She was followed by telemetry psychiatry at [facility], initially recommendations were to transfer to an inpatient psychiatric facility, however today psychiatry recommended that the patient be transferred to [facility] for face-to-face evaluation by a psychiatrist.   During my evaluation, patient denies any chest pain. She still acknowledges suicidal ideation. She also acknowledges that she was living with her daughter for the past 1 year in [place], [place], before that she was in a skilled nursing facility in [place], [place]. She unfortunately has no place to go upon discharge has not of her family members will be able to take her in.   REVIEW OF SYSTEMS:  Constitutional:   No  weight loss, night sweats,  Fevers, chills, fatigue.  HEENT:    No headaches, Dysphagia,Tooth/dental problems,Sore throat,  No sneezing, itching, ear ache, nasal congestion, post nasal drip  Cardio-vascular: No chest  pain,Orthopnea, PND,lower extremity edema, anasarca, palpitations  GI:  No heartburn, indigestion, abdominal pain, nausea, vomiting, diarrhea, melena or hematochezia  Resp: No shortness of breath, cough, hemoptysis,plueritic chest pain.   Skin:  No rash or lesions.  GU:  No dysuria, change in color of urine, no urgency or frequency.  No flank pain.  Musculoskeletal: No joint pain or swelling.  No decreased range of motion.  No back pain.  Endocrine: No heat intolerance, no cold intolerance, no polyuria, no polydipsia  Psych: No change in mood or affect. No depression or anxiety.  No memory loss.   ALLERGIES:  No Known Allergies  PAST MEDICAL HISTORY: Past Medical History:  Diagnosis Date  . Arthritis   . Asthma   . Diabetes mellitus without complication ([place])   . GERD (gastroesophageal reflux disease)   . Hypertension   . Suicide attempt [facility])     PAST SURGICAL HISTORY: Past Surgical History:  Procedure Laterality Date  . CHOLECYSTECTOMY      MEDICATIONS AT HOME: Prior to Admission medications   Medication Sig Start Date End Date Taking? Authorizing Provider  aspirin 81 MG chewable tablet Chew 81 mg by mouth daily.   Yes Historical Provider, MD    FAMILY HISTORY: Family History  Problem Relation Age of Onset  . Diabetes Mother   . Kidney failure Mother   . Diabetes Father   . Hypertension Father      SOCIAL HISTORY:  reports that she has never smoked. She has never used smokeless tobacco. She reports that she drinks alcohol. She reports that she does not use drugs.  PHYSICAL EXAM: Blood pressure (!) 134/91, pulse 72, temperature 98.9 F (37.2 C), temperature source Oral, resp. rate 18,  height 5\' 7"  (1.702 m), weight (!) 160.6 kg (354 lb), last menstrual period 04/11/2008, SpO2 98 %.  General appearance :Awake, alert, not in any distress. Speech Clear. Not toxic Looking. She is morbidly obese HEENT: Atraumatic and Normocephalic, pupils equally  reactive to light and accomodation Neck: supple, no JVD. No cervical lymphadenopathy.  Chest:Good air entry bilaterally, no added sounds  CVS: S1 S2 regular, no murmurs.  Abdomen: Bowel sounds present, Non tender and not distended with no gaurding, rigidity or rebound. Extremities: B/L BKA stump without any ulceration  Neurology:  Non focal Psychiatric: Normal judgment and insight. Alert and oriented x 3. Normal mood. Skin:No Rash Wounds:N/A  LABS ON ADMISSION:  I have personally reviewed following labs and imaging studies  CBC: No results for input(s): WBC, NEUTROABS, HGB, HCT, MCV, PLT in the last 168 hours.  Basic Metabolic Panel: No results for input(s): NA, K, CL, CO2, GLUCOSE, BUN, CREATININE, CALCIUM, MG, PHOS in the last 168 hours.  GFR: CrCl cannot be calculated (Patient's most recent lab result is older than the maximum 21 days allowed.).  Liver Function Tests: No results for input(s): AST, ALT, ALKPHOS, BILITOT, PROT, ALBUMIN in the last 168 hours. No results for input(s): LIPASE, AMYLASE in the last 168 hours. No results for input(s): AMMONIA in the last 168 hours.  Coagulation Profile: No results for input(s): INR, PROTIME in the last 168 hours.  Cardiac Enzymes: No results for input(s): CKTOTAL, CKMB, CKMBINDEX, TROPONINI in the last 168 hours.  BNP (last 3 results) No results for input(s): PROBNP in the last 8760 hours.  HbA1C: No results for input(s): HGBA1C in the last 72 hours.  CBG: No results for input(s): GLUCAP in the last 168 hours.  Lipid Profile: No results for input(s): CHOL, HDL, LDLCALC, TRIG, CHOLHDL, LDLDIRECT in the last 72 hours.  Thyroid Function Tests: No results for input(s): TSH, T4TOTAL, FREET4, T3FREE, THYROIDAB in the last 72 hours.  Anemia Panel: No results for input(s): VITAMINB12, FOLATE, FERRITIN, TIBC, IRON, RETICCTPCT in the last 72 hours.  Urine analysis: No results found for: COLORURINE, APPEARANCEUR, LABSPEC,  PHURINE, GLUCOSEU, HGBUR, BILIRUBINUR, KETONESUR, PROTEINUR, UROBILINOGEN, NITRITE, LEUKOCYTESUR  Sepsis Labs: Lactic Acid, Venous No results found for: [place]   Microbiology: No results found for this or any previous visit (from the past 240 hour(s)).    RADIOLOGIC STUDIES ON ADMISSION: No results found.   ASSESSMENT AND PLAN: Present on Admission: . Chest pain: EKG and enzymes were negative at [facility] was admitted [facility] on 7/23 for this primary reason. I do not think she requires any further workup at this point, she does have advanced chronic kidney disease which limits his ability to do invasive procedures. We will plan on treating with aspirin. We will resume rest of her medications when we receive the discharge summary from [facility].  . Suicidal ideation: Continues to complain of suicidal ideation-she claims that she was hospitalized a few years back at [facility] for similar issues. She does have disposition problems on discharge as well that may be contributing to her stress. We will get psychiatry evaluation. Continue sitter at bedside  . Depression: Apparently was on Seroquel while at [facility] arrival of discharge summary to see exactly what his dosage was.   . CKD (chronic kidney disease) stage 4, GFR 15-29 ml/min: Apparently patient does have a creatinine of around 3.5 during her stay at [facility], not sure what her usual baseline is. But given her noncompliance to medications, it is highly  likely that she probably has either hypertensive nephrosclerosis or diabetic nephropathy. Continue to monitor renal function. Await discharge summary from [facility] for further guidance.  Marland Kitchen HTN (hypertension): Not sure what exactly she was on while at [facility] arrival of discharge summary. Start amlodipine and use hydralazine when necessary for now. Follow and adjust accordingly  . DM, type 2 : Probably was  noncompliant to any of her oral hypoglycemic medications prior to her most recent hospitalization at [facility]. For now place on SSI follow CBGs. Await arrival of discharge summary from [facility]  . Morbid obesity [facility]): Counsel regarding importance of weight loss  . Homelessness: Claims that she is not able to go back to her daughter's place where she was living for the past 1 year.  Note-labs have been ordered and are currently pending  Further plan will depend as patient's clinical course evolves and further radiologic and laboratory data become available. Patient will be monitored closely.  Above noted plan was discussed with patient face to face at bedside, they were in agreement.   CONSULTS: Psychiatry (Called on admission)  DVT Prophylaxis: Prophylactic  Heparin  Code Status: Full Code  Disposition Plan: SNF vs psych facility  Admission status: Inpatient going to tele  Total time spent  55 minutes.Greater than 50% of this time was spent in counseling, explanation of diagnosis, planning of further management, and coordination of care.  [facility] Pager [phone]  If 7PM-7AM, please contact night-coverage [email or web address] Password [email or web address] 04/11/2016, 4:15 PM

## 2016-04-11 NOTE — Significant Event (Signed)
  PENDING ACCEPTANCE TRANFER NOTE:  Call received from:    [place] REQUESTING TRANSFER:  Psychiatry evaluation  CC: Suicidal  HPI:   50 year old bilateral amputee, bedbound and obese came into [facility] hospital complaining about chest pain, depressed and being suicidal. He evaluated with telemetry psych and recommended evaluation by face-to-face psychiatry. Needs clearance from psychiatry for discharge either to inpatient psych hospital versus nursing home. She used to live with her daughter, she lost her housing for now. Also has elevated creatinine of 3.5, per M.D. over there appears to be chronic, potassium was 5.5 this morning but got 1 dose of Late.   PLAN:  According to telephone report, this patient was accepted for transfer to [facility] under [facility] team:  [facility]mit,  I have requested an order be written to call Flow Manager at [phone] upon patient arrival to the floor for final physician assignment who will do the admission and give admitting orders.  SIGNED: Birdie Hopes, MD [facility]  04/11/2016, 12:36 PM

## 2016-04-12 ENCOUNTER — Encounter (HOSPITAL_COMMUNITY): Payer: Self-pay | Admitting: Family Medicine

## 2016-04-12 DIAGNOSIS — F329 Major depressive disorder, single episode, unspecified: Secondary | ICD-10-CM

## 2016-04-12 DIAGNOSIS — R45851 Suicidal ideations: Secondary | ICD-10-CM

## 2016-04-12 DIAGNOSIS — F4325 Adjustment disorder with mixed disturbance of emotions and conduct: Secondary | ICD-10-CM

## 2016-04-12 LAB — GLUCOSE, CAPILLARY
GLUCOSE-CAPILLARY: 139 mg/dL — AB (ref 65–99)
GLUCOSE-CAPILLARY: 158 mg/dL — AB (ref 65–99)
Glucose-Capillary: 133 mg/dL — ABNORMAL HIGH (ref 65–99)
Glucose-Capillary: 160 mg/dL — ABNORMAL HIGH (ref 65–99)

## 2016-04-12 LAB — BASIC METABOLIC PANEL
Anion gap: 7 (ref 5–15)
BUN: 55 mg/dL — AB (ref 6–20)
CALCIUM: 8.1 mg/dL — AB (ref 8.9–10.3)
CHLORIDE: 108 mmol/L (ref 101–111)
CO2: 20 mmol/L — ABNORMAL LOW (ref 22–32)
CREATININE: 3.66 mg/dL — AB (ref 0.44–1.00)
GFR calc non Af Amer: 14 mL/min — ABNORMAL LOW (ref >60)
GFR, EST AFRICAN AMERICAN: 16 mL/min — AB (ref >60)
Glucose, Bld: 162 mg/dL — ABNORMAL HIGH (ref 65–99)
Potassium: 4.9 mmol/L (ref 3.5–5.1)
SODIUM: 135 mmol/L (ref 135–145)

## 2016-04-12 LAB — CBC
HCT: 25.3 % — ABNORMAL LOW (ref 36.0–46.0)
Hemoglobin: 7.9 g/dL — ABNORMAL LOW (ref 12.0–15.0)
MCH: 25.9 pg — ABNORMAL LOW (ref 26.0–34.0)
MCHC: 31.2 g/dL (ref 30.0–36.0)
MCV: 83 fL (ref 78.0–100.0)
PLATELETS: 300 10*3/uL (ref 150–400)
RBC: 3.05 MIL/uL — ABNORMAL LOW (ref 3.87–5.11)
RDW: 15 % (ref 11.5–15.5)
WBC: 6.8 10*3/uL (ref 4.0–10.5)

## 2016-04-12 MED ORDER — FLUOXETINE HCL 10 MG PO CAPS
10.0000 mg | ORAL_CAPSULE | Freq: Every day | ORAL | Status: DC
  Administered 2016-04-12: 10 mg via ORAL
  Filled 2016-04-12 (×2): qty 1

## 2016-04-12 NOTE — Care Management Note (Signed)
Case Management Note  Patient Details  Name: [name] MRN: [id number] Date of Birth: November 08, 1965  Subjective/Objective:  50 y/o f admitted w/Chest pain, Suicidal ideation,htn. Transfer from [facility]. Hx: Bilateral BKA. Psych cons-recc SNF.1:1.Patient states her medicaid card is in her pocketbook that security has-informed admitting-Chuck-will assess since currently patient is listed as no insurance-self pay.  Psych CSW following for SNF.               Action/Plan:d/c plan SNF.   Expected Discharge Date:   (unknown)               Expected Discharge Plan:  Skilled Nursing Facility  In-House Referral:  Clinical Social Work  Discharge planning Services  CM Consult  Post Acute Care Choice:    Choice offered to:     DME Arranged:    DME Agency:     HH Arranged:    [place] Agency:     Status of Service:  In process, will continue to follow  If discussed at Long Length of Stay Meetings, dates discussed:    Additional Comments:  Dessa Phi, RN 04/12/2016, 3:49 PM

## 2016-04-12 NOTE — Clinical Social Work Note (Addendum)
Clinical Social Work Assessment  Patient Details  Name: [name] MRN: [id number] Date of Birth: 1966-01-09  Date of referral:  04/12/16               Reason for consult:  Facility Placement, Insurance Barriers, Suicide Risk/Attempt, Housing Concerns/Homelessness, Mental Health Concerns                Permission sought to share information with:  Facility Sport and exercise psychologist, Family Supports, Psychiatrist, Case Manager Permission granted to share information::  Yes, Verbal Permission Granted  Name::        Agency::  SNF  Relationship::  Daughters  Contact Information:  Juanita: [phone]  Housing/Transportation Living arrangements for the past 2 months:  Single Family Home Source of Information:  Patient, Medical Team Patient Interpreter Needed:  None Criminal Activity/Legal Involvement Pertinent to Current Situation/Hospitalization:  No - Comment as needed Significant Relationships:  Adult Children Lives with:  Adult Children Do you feel safe going back to the place where you live?  Yes Need for family participation in patient care:  Yes (Patient is dependable and needs support)   Care giving concerns:  Patient has chronic history of diabetes millitus, insulin dependent, bilateral below the knee amputation, and chronic kidney disease. Patient presented in [place] ED with chest pain and suicidal thoughts and has been transferred to [facility]. At this time patient reports she is having suicidal ideations but does not have a plan. Patient denies homicidal ideations. Patient needs higher level of care at this time-SNF.    Social Worker assessment / plan:  Insurance account manager met with patient at bedside, explained reason for consult. Patient receptive to assessment. Patient reports is depressed and has been having suicidal ideations, but denies a plan. Patient reports, "I do not want to die." Patient reports, she has been living with her daughter for about a year, this week they  have to move out of their home. Patient expressed her daughter is looking for a home at this time and she plans to move in with her. Patient uses a motorize wheel chair to ambulate but reports  her chair has been broken. She reports her daughter has now fixed the chair. Patient reports she will go to SNF only temporarily . She reports she has medicaid and receives a disability check, which she uses to pay her bills.  Patient reports she is hopeful to find a place to live and find a boyfriend.   Plan: Assist patient with SNF placement.   LCSWA and Engineer, maintenance (IT) from [facility] and [name] [employer] discussed SNF options with patient. Tammy will determine if patient qualifies at this time.   Employment status:  Disabled (Comment on whether or not currently receiving Disability) Insurance information:  Self Pay (Medicaid Pending) PT Recommendations:  [place], Not assessed at this time Information / Referral to community resources:  [place]  Patient/Family's Response to care:  Agreeable. Not sure if patient is willing to give up her check to pay for SNF.   Patient/Family's Understanding of and Emotional Response to Diagnosis, Current Treatment, and Prognosis:  Patient understands she needs a higher level of care but wants to be in a home with her daughter and grandchildren. " I will go until my daughter can come and get me."  LCSWA has attempted to call patient contacts listed. Patient reports she has not been able to get in contact with daughter as well.    Emotional  Assessment Appearance:  Disheveled, Appears stated age Attitude/Demeanor/Rapport:  Inconsistent Affect (typically observed):  Appropriate, Accepting Orientation:  Oriented to Self, Oriented to Place, Oriented to  Time, Oriented to Situation Alcohol / Substance use:  Not Applicable Psych involvement (Current and /or in the community):  Yes, current. Patient  reports SI, but denies plan.   Discharge Needs  Concerns to be addressed:  Lack of Support, Homelessness, Care Coordination, Discharge Planning Concerns, Mental Health Concerns Readmission within the last 30 days:    Current discharge risk:  Dependent with Mobility, Lack of support system, Homeless Barriers to Discharge:  Homeless with medical needs, Inadequate or no insurance   Lia Hopping, [name] 04/12/2016, 3:19 PM

## 2016-04-12 NOTE — Progress Notes (Addendum)
PROGRESS NOTE    [name]  [id number]  DOB: 02-21-1966  DOA: 04/11/2016 PCP: No primary care provider on file. Outpatient Specialists:   Hospital course: [name] is a 50 y.o. female with medical history significant of of type 2 diabetes, hypertension, bilateral BKA, and occasional noncompliance prior to this admission, depression/bipolar disorder with prior suicidal attempt who initially was admitted to [facility] on 04/01/16 for evaluation of chest pain during that visit she also claimed that she had suicidal ideation by overdosing on multiple medications. She was subsequently admitted to [facility], cardiac enzymes remained and negative. Cipro course was complicated by development of uncontrolled hypertension for which she was treated with multiple antihypertensives and a UTI for which she required intravenous antibiotics. She was followed by telemetry psychiatry at [facility], initially recommendations were to transfer to an inpatient psychiatric facility, however today psychiatry recommended that the patient be transferred to [facility] for face-to-face evaluation by a psychiatrist.   Assessment & Plan:  . Chest pain: EKG and enzymes were negative at [facility] was admitted [facility] on 7/23 for this primary reason. I do not think she requires any further workup at this point, she does have advanced chronic kidney disease which limits his ability to do invasive procedures. We will plan on treating with aspirin.   . Suicidal ideation: Continues to complain of suicidal ideation-she claims that she was hospitalized a few years back at [facility] for similar issues. She does have disposition problems on discharge as well that may be contributing to her stress. We will get psychiatry evaluation. Continue sitter at bedside.  Further management pending psychiatric evaluation.   . Depression: Continue Seroquel and fluoxetine.  . CKD (chronic  kidney disease) stage 4, GFR 15-29 ml/min: Apparently patient does have a creatinine of around 3.5 during her stay at [facility], not sure what her usual baseline is. But given her noncompliance to medications, it is highly likely that she probably has either hypertensive nephrosclerosis or diabetic nephropathy. Continue to monitor renal function. Pt says she has never seen a nephrologist.  Would schedule outpatient appointment with [facility] at discharge.  Pt says that she would never consider dialysis even if it means death but given her current severe depression she could change her feelings about this after treatment.   Marland Kitchen HTN (hypertension): Controlled at this time.   Continue current meds.    . DM, type 2 : Probably was noncompliant to any of her oral hypoglycemic medications prior to her most recent hospitalization at [facility]. For now place on SSI follow CBGs.   CBG (last 3)   Recent Labs  04/11/16 2126 04/12/16 0735 04/12/16 1136  GLUCAP 147* 139* 133*   . Morbid obesity ([place]): Counsel regarding importance of weight loss  . Homelessness: Claims that she is not able to go back to her daughter's place where she was living for the past 1 year.  Further plan will depend as patient's clinical course evolves and further radiologic and laboratory data become available. Patient will be monitored closely.  Above noted plan was discussed with patient face to face at bedside, they were in agreement.   CONSULTS: Psychiatry (Called on admission)  DVT Prophylaxis: Prophylactic  Heparin  Code Status: Full Code  Disposition Plan: SNF vs psych facility.  Pt is medically stable for transfer at this time.   Subjective: Pt endorses ongoing severe depression and suicidal thoughts  Objective: Vitals:   04/11/16 1520 04/11/16 2043 04/12/16  0639  BP: (!) 134/91 (!) 124/43 124/80  Pulse: 72 72 67  Resp: 18 20 20   Temp: 98.9 F (37.2 C) 98.5 F (36.9 C) 98.8  F (37.1 C)  TempSrc: Oral Oral Oral  SpO2: 98% 97% 99%  Weight: (!) 160.6 kg (354 lb)    Height: 5\' 7"  (1.702 m)      Intake/Output Summary (Last 24 hours) at 04/12/16 1152 Last data filed at 04/12/16 0902  Gross per 24 hour  Intake              460 ml  Output                0 ml  Net              460 ml   Filed Weights   04/11/16 1520  Weight: (!) 160.6 kg (354 lb)    Exam:  General appearance :Awake, alert, not in any distress. Cooperative.  Speech Clear. Nontoxic appearing. She is morbidly obese HEENT: Atraumatic and Normocephalic, pupils equally reactive to light and accomodation. Neck: supple, no JVD. No cervical lymphadenopathy.  Chest:Good air entry bilaterally, no added sounds  CVS: S1 S2 regular, no murmurs.  Abdomen: Bowel sounds present, Non tender and not distended with no gaurding, rigidity or rebound. Extremities: B/L BKA stump without any ulceration  Neurology:  Non focal Psychiatric: Normal judgment and insight. Alert and oriented x 3. Normal mood. Skin:No Rash  Data Reviewed: Basic Metabolic Panel:  Recent Labs Lab 04/11/16 1611 04/12/16 0543  NA 133* 135  K 5.2* 4.9  CL 106 108  CO2 20* 20*  GLUCOSE 184* 162*  BUN 53* 55*  CREATININE 3.68* 3.66*  CALCIUM 8.3* 8.1*   Liver Function Tests: No results for input(s): AST, ALT, ALKPHOS, BILITOT, PROT, ALBUMIN in the last 168 hours. No results for input(s): LIPASE, AMYLASE in the last 168 hours. No results for input(s): AMMONIA in the last 168 hours. CBC:  Recent Labs Lab 04/11/16 1611 04/12/16 0543  WBC 6.6 6.8  HGB 8.6* 7.9*  HCT 27.2* 25.3*  MCV 82.9 83.0  PLT 317 300   Cardiac Enzymes: No results for input(s): CKTOTAL, CKMB, CKMBINDEX, TROPONINI in the last 168 hours. CBG (last 3)   Recent Labs  04/11/16 2126 04/12/16 0735 04/12/16 1136  GLUCAP 147* 139* 133*   Recent Results (from the past 240 hour(s))  MRSA PCR Screening     Status: None   Collection Time: 04/11/16  3:18  PM  Result Value Ref Range Status   MRSA by PCR NEGATIVE NEGATIVE Final    Comment:        The GeneXpert MRSA Assay (FDA approved for NASAL specimens only), is one component of a comprehensive MRSA colonization surveillance program. It is not intended to diagnose MRSA infection nor to guide or monitor treatment for MRSA infections.      Studies: No results found.   Scheduled Meds: . aspirin EC  81 mg Oral Daily  . FLUoxetine  10 mg Oral Daily  . fluticasone  1 spray Each Nare BID  . heparin  5,000 Units Subcutaneous Q8H  . hydrALAZINE  25 mg Oral Q8H  . insulin aspart  0-15 Units Subcutaneous TID WC  . insulin detemir  10 Units Subcutaneous BID  . loratadine  10 mg Oral Daily  . metoprolol tartrate  25 mg Oral BID  . pantoprazole  40 mg Oral Daily  . pregabalin  25 mg Oral QHS  . QUEtiapine  25 mg Oral TID  . sodium chloride flush  3 mL Intravenous Q12H   Continuous Infusions:   Active Problems:   Chest pain   Depression   CKD (chronic kidney disease) stage 4, GFR 15-29 ml/min (HCC)   HTN (hypertension)   DM coma, type 2 (HCC)   S/P bilateral BKA (below knee amputation) ([place])   Peripheral neuropathy ([place])   Morbid obesity ([place])   Time spent:   Irwin Brakeman, MD, FAAFP [facility] Pager [phone]  If 7PM-7AM, please contact night-coverage [email or web address] Password [email or web address] 04/12/2016, 11:52 AM    LOS: 1 day

## 2016-04-12 NOTE — Consult Note (Signed)
[place] Psychiatry Consult   Reason for Consult:  Depression and suicide ideation (passive) Referring Physician:  Dr. Wynetta Emery Patient Identification: [name] MRN:  [id number] Principal Diagnosis: <principal problem not specified> Diagnosis:   Patient Active Problem List   Diagnosis Date Noted  . Chest pain [R07.9] 04/11/2016  . Depression [F32.9] 04/11/2016  . CKD (chronic kidney disease) stage 4, GFR 15-29 ml/min (HCC) [N18.4] 04/11/2016  . HTN (hypertension) [I10] 04/11/2016  . DM coma, type 2 ([place]) [E11.69] 04/11/2016  . S/P bilateral BKA (below knee amputation) ([place]) [L57.262, Z89.511] 04/11/2016  . Peripheral neuropathy ([place]) [G62.9] 04/11/2016  . Morbid obesity ([place]) [E66.01] 04/11/2016    Total Time spent with patient: 1 hour  Subjective:   [name] is a 50 y.o. female patient admitted with depression and passive suicide ideation.  HPI:  [name] is a 50 y.o. female with medical history significant of of type 2 diabetes, hypertension, bilateral BKA, and occasional noncompliance prior to this admission, depression/bipolar disorder with prior suicidal attempt who initially was admitted to [facility] on 04/01/16 for evaluation of chest pain. Patient seen face-to-face for this psychiatric consultation and evaluation of increased symptoms of depression and suicidal ideation previously has expressed suicidal plan of intentional overdose of medications. Patient reported she cannot tell how she is going to harm herself today. Patient stated at the same time, "she does not want to die and refused hospice care". Patient endorses depression most situational. Patient has similar symptoms of depression and suicidal ideation 2 years ago before being placed in a skilled nursing facility in [place], [place] from the [facility] emergency department after her boyfriend and boyfriend's mother left her. Patient is also refusing to go to long-term skilled nursing  facility saying that she has been in several facilities in the past and willing to participate short-term skilled nursing facility if possible until she finds a stable place with her daughter, daughter's boyfriend and 2 young grand children. Patient is also reported she wishes to find a boyfriend and has no current relationship since 2 years ago her boyfriend left her. Patient reported her main stress is lack of place to live since her daughter moved out of the house because of financial difficulties. Patient reportedly has a disability income but cannot afford her own house because she needed to pay for her bed and also utilities. Patient denies alcohol abuse and also illicit substance abuse.  She was initially admitted to [facility] for chest pain, cardiac enzymes remained and negative. Cipro course was complicated by development of uncontrolled hypertension for which she was treated with multiple antihypertensives and a UTI for which she required intravenous antibiotics. She was followed by telemetry psychiatry at [facility], initially recommendations were to transfer to an inpatient psychiatric facility, however today psychiatry recommended that the patient be transferred to [facility] for face-to-face evaluation by a psychiatrist.   During my evaluation patient endorses ongoing symptoms of depression and passive suicidal ideation without intention or plan to commit suicide.Patient also reportedly has no previous acute psychiatric hospitalizations her suicidal attempts.  She also acknowledges that she was living with her daughter for the past 1 year in [place], [place], before that she was in a skilled nursing facility in [place], [place]. She unfortunately has no place to go upon discharge has not of her family members will be able to take her in. Based on my evaluation patient has depression which is situational and related to lack of  support from the family, financial  difficulties and place to live.  Past Psychiatric History: unknown  Risk to Self: Is patient at risk for suicide?: Yes Risk to Others:   Prior Inpatient Therapy:   Prior Outpatient Therapy:    Past Medical History:  Past Medical History:  Diagnosis Date  . Arthritis   . Asthma   . Diabetes mellitus without complication ([place])   . GERD (gastroesophageal reflux disease)   . Hypertension   . Suicide attempt [facility])     Past Surgical History:  Procedure Laterality Date  . CHOLECYSTECTOMY     Family History:  Family History  Problem Relation Age of Onset  . Diabetes Mother   . Kidney failure Mother   . Diabetes Father   . Hypertension Father    Family Psychiatric  History: Patient denies family history of mental illness.  Social History:  History  Alcohol Use  . Yes    Comment: Occasional     History  Drug Use No    Social History   Social History  . Marital status: Single    Spouse name: N/A  . Number of children: N/A  . Years of education: N/A   Social History Main Topics  . Smoking status: Never Smoker  . Smokeless tobacco: Never Used  . Alcohol use Yes     Comment: Occasional  . Drug use: No  . Sexual activity: Not Asked   Other Topics Concern  . None   Social History Narrative  . None   Additional Social History:    Allergies:  No Known Allergies  Labs:  Results for orders placed or performed during the hospital encounter of 04/11/16 (from the past 48 hour(s))  MRSA PCR Screening     Status: None   Collection Time: 04/11/16  3:18 PM  Result Value Ref Range   MRSA by PCR NEGATIVE NEGATIVE    Comment:        The GeneXpert MRSA Assay (FDA approved for NASAL specimens only), is one component of a comprehensive MRSA colonization surveillance program. It is not intended to diagnose MRSA infection nor to guide or monitor treatment for MRSA infections.   CBC     Status: Abnormal   Collection Time: 04/11/16  4:11 PM  Result Value Ref Range    WBC 6.6 4.0 - 10.5 K/uL   RBC 3.28 (L) 3.87 - 5.11 MIL/uL   Hemoglobin 8.6 (L) 12.0 - 15.0 g/dL   HCT 27.2 (L) 36.0 - 46.0 %   MCV 82.9 78.0 - 100.0 fL   MCH 26.2 26.0 - 34.0 pg   MCHC 31.6 30.0 - 36.0 g/dL   RDW 15.3 11.5 - 15.5 %   Platelets 317 150 - 400 K/uL  Basic metabolic panel     Status: Abnormal   Collection Time: 04/11/16  4:11 PM  Result Value Ref Range   Sodium 133 (L) 135 - 145 mmol/L   Potassium 5.2 (H) 3.5 - 5.1 mmol/L    Comment: NO VISIBLE HEMOLYSIS   Chloride 106 101 - 111 mmol/L   CO2 20 (L) 22 - 32 mmol/L   Glucose, Bld 184 (H) 65 - 99 mg/dL   BUN 53 (H) 6 - 20 mg/dL   Creatinine, Ser 3.68 (H) 0.44 - 1.00 mg/dL   Calcium 8.3 (L) 8.9 - 10.3 mg/dL   GFR calc non Af Amer 13 (L) >60 mL/min   GFR calc Af Amer 16 (L) >60 mL/min    Comment: (NOTE) The  eGFR has been calculated using the CKD EPI equation. This calculation has not been validated in all clinical situations. eGFR's persistently <60 mL/min signify possible Chronic Kidney Disease.    Anion gap 7 5 - 15  Glucose, capillary     Status: Abnormal   Collection Time: 04/11/16  4:38 PM  Result Value Ref Range   Glucose-Capillary 239 (H) 65 - 99 mg/dL  Glucose, capillary     Status: Abnormal   Collection Time: 04/11/16  9:26 PM  Result Value Ref Range   Glucose-Capillary 147 (H) 65 - 99 mg/dL   Comment 1 Notify RN    Comment 2 Document in Chart   Basic metabolic panel     Status: Abnormal   Collection Time: 04/12/16  5:43 AM  Result Value Ref Range   Sodium 135 135 - 145 mmol/L   Potassium 4.9 3.5 - 5.1 mmol/L   Chloride 108 101 - 111 mmol/L   CO2 20 (L) 22 - 32 mmol/L   Glucose, Bld 162 (H) 65 - 99 mg/dL   BUN 55 (H) 6 - 20 mg/dL   Creatinine, Ser 3.66 (H) 0.44 - 1.00 mg/dL   Calcium 8.1 (L) 8.9 - 10.3 mg/dL   GFR calc non Af Amer 14 (L) >60 mL/min   GFR calc Af Amer 16 (L) >60 mL/min    Comment: (NOTE) The eGFR has been calculated using the CKD EPI equation. This calculation has not been  validated in all clinical situations. eGFR's persistently <60 mL/min signify possible Chronic Kidney Disease.    Anion gap 7 5 - 15  CBC     Status: Abnormal   Collection Time: 04/12/16  5:43 AM  Result Value Ref Range   WBC 6.8 4.0 - 10.5 K/uL   RBC 3.05 (L) 3.87 - 5.11 MIL/uL   Hemoglobin 7.9 (L) 12.0 - 15.0 g/dL   HCT 25.3 (L) 36.0 - 46.0 %   MCV 83.0 78.0 - 100.0 fL   MCH 25.9 (L) 26.0 - 34.0 pg   MCHC 31.2 30.0 - 36.0 g/dL   RDW 15.0 11.5 - 15.5 %   Platelets 300 150 - 400 K/uL  Glucose, capillary     Status: Abnormal   Collection Time: 04/12/16  7:35 AM  Result Value Ref Range   Glucose-Capillary 139 (H) 65 - 99 mg/dL  Glucose, capillary     Status: Abnormal   Collection Time: 04/12/16 11:36 AM  Result Value Ref Range   Glucose-Capillary 133 (H) 65 - 99 mg/dL    Current Facility-Administered Medications  Medication Dose Route Frequency Provider Last Rate Last Dose  . 0.9 %  sodium chloride infusion  250 mL Intravenous PRN Jonetta Osgood, MD      . acetaminophen (TYLENOL) tablet 650 mg  650 mg Oral Q6H PRN Shanker Kristeen Mans, MD       Or  . acetaminophen (TYLENOL) suppository 650 mg  650 mg Rectal Q6H PRN Shanker Kristeen Mans, MD      . albuterol (PROVENTIL) (2.5 MG/3ML) 0.083% nebulizer solution 2.5 mg  2.5 mg Nebulization Q2H PRN Shanker Kristeen Mans, MD      . aspirin EC tablet 81 mg  81 mg Oral Daily Shanker Kristeen Mans, MD      . FLUoxetine (PROZAC) capsule 10 mg  10 mg Oral Daily Clanford L Johnson, MD      . fluticasone (FLONASE) 50 MCG/ACT nasal spray 1 spray  1 spray Each Nare BID Shanker Kristeen Mans, MD  1 spray at 04/11/16 2224  . heparin injection 5,000 Units  5,000 Units Subcutaneous Q8H Jonetta Osgood, MD   5,000 Units at 04/12/16 [phone]  . hydrALAZINE (APRESOLINE) injection 10 mg  10 mg Intravenous Q6H PRN Jonetta Osgood, MD      . hydrALAZINE (APRESOLINE) tablet 25 mg  25 mg Oral Q8H Shanker Kristeen Mans, MD   25 mg at 04/12/16 0641  . insulin aspart (novoLOG)  injection 0-15 Units  0-15 Units Subcutaneous TID WC Jonetta Osgood, MD   2 Units at 04/12/16 0815  . insulin detemir (LEVEMIR) injection 10 Units  10 Units Subcutaneous BID Jonetta Osgood, MD   10 Units at 04/11/16 2140  . loratadine (CLARITIN) tablet 10 mg  10 mg Oral Daily Jonetta Osgood, MD   10 mg at 04/11/16 2140  . metoprolol tartrate (LOPRESSOR) tablet 25 mg  25 mg Oral BID Jonetta Osgood, MD   25 mg at 04/11/16 2141  . ondansetron (ZOFRAN) injection 4 mg  4 mg Intravenous Q6H PRN Shanker Kristeen Mans, MD      . ondansetron [facility]) tablet 4 mg  4 mg Oral Q6H PRN Shanker Kristeen Mans, MD      . oxyCODONE (Oxy IR/ROXICODONE) immediate release tablet 5 mg  5 mg Oral Q4H PRN Jonetta Osgood, MD   5 mg at 04/12/16 0641  . pantoprazole (PROTONIX) EC tablet 40 mg  40 mg Oral Daily Shanker Kristeen Mans, MD      . pregabalin (LYRICA) capsule 25 mg  25 mg Oral QHS Jonetta Osgood, MD   25 mg at 04/11/16 2140  . QUEtiapine (SEROQUEL) tablet 25 mg  25 mg Oral TID Jonetta Osgood, MD   25 mg at 04/11/16 2141  . sodium chloride flush (NS) 0.9 % injection 3 mL  3 mL Intravenous Q12H Jonetta Osgood, MD   3 mL at 04/12/16 0815  . sodium chloride flush (NS) 0.9 % injection 3 mL  3 mL Intravenous PRN Shanker Kristeen Mans, MD        Musculoskeletal: Strength & Muscle Tone: decreased Gait & Station: Bilateral BKA 2009. Patient leans: N/A  Psychiatric Specialty Exam: Physical Exam as per history and physical   ROS Complaining about depression, anxiety, restlessness, insomnia, hopelessness and worthlessness. No Fever-chills, No Headache, No changes with Vision or hearing, reports vertigo No problems swallowing food or Liquids, No Chest pain, Cough or Shortness of Breath, No Abdominal pain, No Nausea or Vommitting, Bowel movements are regular, No Blood in stool or Urine, No dysuria, No new skin rashes or bruises, No new joints pains-aches,  No new weakness, tingling, numbness in any  extremity, No recent weight gain or loss, No polyuria, polydypsia or polyphagia,   A full 10 point Review of Systems was done, except as stated above, all other Review of Systems were negative.  Blood pressure 124/80, pulse 67, temperature 98.8 F (37.1 C), temperature source Oral, resp. rate 20, height 5' 7"  (1.702 m), weight (!) 160.6 kg (354 lb), last menstrual period 04/11/2008, SpO2 99 %.Body mass index is 55.44 kg/m.  General Appearance: Disheveled and Guarded  Eye Contact:  Good  Speech:  Clear and Coherent and Slow  Volume:  Decreased  Mood:  Anxious and Depressed  Affect:  Constricted and Depressed  Thought Process:  Coherent and Goal Directed  Orientation:  Full (Time, Place, and Person)  Thought Content:  WDL  Suicidal Thoughts:  Yes.  without intent/plan  Homicidal  Thoughts:  No  Memory:  Immediate;   Fair Recent;   Fair  Judgement:  Fair  Insight:  Fair  Psychomotor Activity:  Decreased  Concentration:  Concentration: Fair and Attention Span: Fair  Recall:  Poor  Fund of Knowledge:  Fair  Language:  Good  Akathisia:  Negative  Handed:  Right  AIMS (if indicated):     Assets:  Communication Skills Desire for Improvement Financial Resources/Insurance Leisure Time Resilience Social Support  ADL's:  Impaired  Cognition:  Impaired,  Mild  Sleep:        Treatment Plan Summary: Patient admitted with multiple medical problems, psychosocial stresses and increased symptoms of depression, anxiety, suicidal ideation without intention and plan. Patient is willing to participate in inpatient treatment and also seeking case management and social service for psychosocial support and contact with her daughter who has been relocating from her previous house secondary to financial difficulties. Based on my clinical impression patient current clinical presentation is secondary to psychosocial situation does not meet criteria for acute psychiatric hospitalization.  Safety  concerns: Patient endorses passive suicidal ideation and questionable intention or plans but cannot contract for safety so we will continue safety sitter  Homicidal ideation patient denies homicidal ideation, intention or plans.   Depression: We will increase fluoxetine to 20 mg daily for depression  Mood swings and irritability: We'll continue Seroquel 25 mg 3 times daily  Referred to the [name] to contact patient family members regarding collateral information and possibly psychosocial support needs.    Disposition: Patient benefit from short-term/brief skilled nursing facility until her family is able to find a place to live  Supportive therapy provided about ongoing stressors.  Ambrose Finland, MD 04/12/2016 11:54 AM

## 2016-04-12 NOTE — NC FL2 (Signed)
[place] LEVEL OF CARE SCREENING TOOL     IDENTIFICATION  Patient Name: [name] Birthdate: 1966-08-16 Sex: female Admission Date (Current Location): 04/11/2016  [facility] and [place] Number:  Herbalist and Address:  [facility],  [address] [place], [place]      Provider Number: [id number]  Attending Physician Name and Address:  Murlean Iba, MD  Relative Name and Phone Number:  Siham Stoner: [phone]    Current Level of Care: Hospital Recommended Level of Care: [place] Prior Approval Number:    Date Approved/Denied:   PASRR Number:    Discharge Plan: SNF    Current Diagnoses: Patient Active Problem List   Diagnosis Date Noted  . Chest pain 04/11/2016  . Depression 04/11/2016  . CKD (chronic kidney disease) stage 4, GFR 15-29 ml/min (HCC) 04/11/2016  . HTN (hypertension) 04/11/2016  . DM coma, type 2 ([place]) 04/11/2016  . S/P bilateral BKA (below knee amputation) ([place]) 04/11/2016  . Peripheral neuropathy ([place]) 04/11/2016  . Morbid obesity ([place]) 04/11/2016    Orientation RESPIRATION BLADDER Height & Weight     Self, Time, Situation, Place  O2 (2L) Incontinent Weight: (!) 354 lb (160.6 kg) Height:  5\' 7"  (170.2 cm) (Below the kneww amputation)  BEHAVIORAL SYMPTOMS/MOOD NEUROLOGICAL BOWEL NUTRITION STATUS      Incontinent Diet  AMBULATORY STATUS COMMUNICATION OF NEEDS Skin   Total Care Verbally Normal                       Personal Care Assistance Level of Assistance  Bathing, Feeding, Dressing Bathing Assistance: Maximum assistance Feeding assistance: Independent Dressing Assistance: Maximum assistance     Functional Limitations Info  Sight, Hearing, Speech Sight Info: Impaired Hearing Info: Adequate Speech Info: Adequate    SPECIAL CARE FACTORS FREQUENCY                       Contractures Contractures Info: Not present     Additional Factors Info  Code Status, Insulin Sliding Scale, Allergies, Psychotropic Code Status Info: Full Code Allergies Info: No Known Allergies Psychotropic Info: Yes Insulin Sliding Scale Info: insulin aspart (novoLOG) injection 0-15 Units       Current Medications (04/12/2016):  This is the current hospital active medication list Current Facility-Administered Medications  Medication Dose Route Frequency Provider Last Rate Last Dose  . 0.9 %  sodium chloride infusion  250 mL Intravenous PRN Jonetta Osgood, MD      . acetaminophen (TYLENOL) tablet 650 mg  650 mg Oral Q6H PRN Shanker Kristeen Mans, MD       Or  . acetaminophen (TYLENOL) suppository 650 mg  650 mg Rectal Q6H PRN Shanker Kristeen Mans, MD      . albuterol (PROVENTIL) (2.5 MG/3ML) 0.083% nebulizer solution 2.5 mg  2.5 mg Nebulization Q2H PRN Jonetta Osgood, MD      . aspirin EC tablet 81 mg  81 mg Oral Daily Jonetta Osgood, MD   81 mg at 04/12/16 1030  . FLUoxetine (PROZAC) capsule 10 mg  10 mg Oral Daily Clanford Marisa Hua, MD   10 mg at 04/12/16 1030  . fluticasone (FLONASE) 50 MCG/ACT nasal spray 1 spray  1 spray Each Nare BID Jonetta Osgood, MD   1 spray at 04/12/16 1030  . heparin injection 5,000 Units  5,000 Units Subcutaneous Q8H Shanker Kristeen Mans, MD   5,000  Units at 04/12/16 1523  . hydrALAZINE (APRESOLINE) injection 10 mg  10 mg Intravenous Q6H PRN Jonetta Osgood, MD      . hydrALAZINE (APRESOLINE) tablet 25 mg  25 mg Oral Q8H Shanker Kristeen Mans, MD   25 mg at 04/12/16 1523  . insulin aspart (novoLOG) injection 0-15 Units  0-15 Units Subcutaneous TID WC Jonetta Osgood, MD   2 Units at 04/12/16 1220  . insulin detemir (LEVEMIR) injection 10 Units  10 Units Subcutaneous BID Jonetta Osgood, MD   10 Units at 04/12/16 1030  . loratadine (CLARITIN) tablet 10 mg  10 mg Oral Daily Jonetta Osgood, MD   10 mg at 04/12/16 1030  . metoprolol tartrate (LOPRESSOR) tablet 25 mg  25 mg Oral BID Jonetta Osgood, MD   25 mg at 04/12/16 1030  . ondansetron (ZOFRAN) injection 4 mg  4 mg Intravenous Q6H PRN Shanker Kristeen Mans, MD      . ondansetron [facility]) tablet 4 mg  4 mg Oral Q6H PRN Shanker Kristeen Mans, MD      . oxyCODONE (Oxy IR/ROXICODONE) immediate release tablet 5 mg  5 mg Oral Q4H PRN Jonetta Osgood, MD   5 mg at 04/12/16 0641  . pantoprazole (PROTONIX) EC tablet 40 mg  40 mg Oral Daily Jonetta Osgood, MD   40 mg at 04/12/16 1030  . pregabalin (LYRICA) capsule 25 mg  25 mg Oral QHS Jonetta Osgood, MD   25 mg at 04/11/16 2140  . QUEtiapine (SEROQUEL) tablet 25 mg  25 mg Oral TID Jonetta Osgood, MD   25 mg at 04/12/16 1523  . sodium chloride flush (NS) 0.9 % injection 3 mL  3 mL Intravenous Q12H Jonetta Osgood, MD   3 mL at 04/12/16 0815  . sodium chloride flush (NS) 0.9 % injection 3 mL  3 mL Intravenous PRN Shanker Kristeen Mans, MD         Discharge Medications: Please see discharge summary for a list of discharge medications.  Relevant Imaging Results:  Relevant Lab Results:   Additional Information ss#[id number]  Lia Hopping, [name]

## 2016-04-13 LAB — GLUCOSE, CAPILLARY
GLUCOSE-CAPILLARY: 137 mg/dL — AB (ref 65–99)
GLUCOSE-CAPILLARY: 195 mg/dL — AB (ref 65–99)
Glucose-Capillary: 158 mg/dL — ABNORMAL HIGH (ref 65–99)
Glucose-Capillary: 166 mg/dL — ABNORMAL HIGH (ref 65–99)

## 2016-04-13 MED ORDER — FLUOXETINE HCL 20 MG PO CAPS
20.0000 mg | ORAL_CAPSULE | Freq: Every day | ORAL | Status: DC
  Administered 2016-04-13 – 2016-04-25 (×13): 20 mg via ORAL
  Filled 2016-04-13 (×13): qty 1

## 2016-04-13 NOTE — Progress Notes (Addendum)
Facility Representative explained patient denied [name] [employer] [facility] as patient is a suicide risk at this time.  Patient has not been cleared by psychiatrist as she continues to express suicidal ideations at this time.  Patient continues to have a Actuary.  Level II PASRR will need to approved and completed once cleared by Psychiatrist.

## 2016-04-13 NOTE — Progress Notes (Signed)
PROGRESS NOTE    [name]  [id number]  DOB: [birth year]  DOA: 04/11/2016 PCP: No primary care provider on file. Outpatient Specialists:   Hospital course: [name] is a 50 y.o. female with medical history significant of of type 2 diabetes, hypertension, bilateral BKA, and occasional noncompliance prior to this admission, depression/bipolar disorder with prior suicidal attempt who initially was admitted to [facility] on 04/01/16 for evaluation of chest pain during that visit she also claimed that she had suicidal ideation by overdosing on multiple medications. She was subsequently admitted to [facility], cardiac enzymes remained and negative. Cipro course was complicated by development of uncontrolled hypertension for which she was treated with multiple antihypertensives and a UTI for which she required intravenous antibiotics. She was followed by telemetry psychiatry at [facility], initially recommendations were to transfer to an inpatient psychiatric facility, psychiatry recommended that the patient be transferred to [facility] for face-to-face evaluation by a psychiatrist.   Assessment & Plan:  . Chest pain: EKG and enzymes were negative at [facility] was admitted [facility] on 7/23 for this primary reason. I do not think she requires any further workup at this point, she does have advanced chronic kidney disease which limits his ability to do invasive procedures. Continue treating with aspirin.   . Suicidal ideation: Continues to complain of suicidal ideation-she claims that she was hospitalized a few years back at [facility] for similar issues. She does have disposition problems on discharge as well that may be contributing to her stress. We will get psychiatry evaluation. Continue sitter at bedside.  Further management pending psychiatric evaluation. They have increased her fluoxetine to 20 mg.  They have recommended SNF placement. They  recommend continued Air cabin crew.    . Depression: Continue Seroquel for mood fluctuations and fluoxetine.  . CKD (chronic kidney disease) stage 4, GFR 15-29 ml/min: Apparently patient does have a creatinine of around 3.5 during her stay at [facility], not sure what her usual baseline is. But given her noncompliance to medications, it is highly likely that she probably has either hypertensive nephrosclerosis or diabetic nephropathy. Continue to monitor renal function. Pt says she has never seen a nephrologist.  Would schedule outpatient appointment with [facility] at discharge.  Pt says that she would never consider dialysis even if it means death but given her current severe depression she could change her feelings about this after treatment.   Marland Kitchen HTN (hypertension): Controlled at this time.   Continue current meds.    . DM, type 2 : Probably was noncompliant to any of her oral hypoglycemic medications prior to her most recent hospitalization at [facility]. For now place on SSI follow CBGs.   CBG (last 3)   Recent Labs  04/12/16 2051 04/13/16 0718 04/13/16 1133  GLUCAP 160* 137* 158*   . Morbid obesity ([place]): Counseled regarding importance of weight loss  . Homelessness: Claims that she is not able to go back to her daughter's place where she was living for the past 1 year.  [name] consulted to help with placement.     CONSULTS: Psychiatry (Called on admission) [name] Care Manager  DVT Prophylaxis: Prophylactic  Heparin  Code Status: Full Code  Disposition Plan: SNF vs psych facility.  Pt is medically stable for transfer at this time.   Subjective: Pt endorses ongoing severe depression and suicidal thoughts  Objective: Vitals:   04/12/16 2053 04/13/16 0500 04/13/16 1343 04/13/16 1348  BP: (!) 115/50 (!) 110/51 Marland Kitchen)  144/74 (!) 144/74  Pulse: 67 63  62  Resp: 18 17  18   Temp: 98.3 F (36.8 C) 98.4 F (36.9 C)  98.5 F (36.9 C)  TempSrc:  Axillary Axillary  Oral  SpO2: 96% 96%  95%  Weight:      Height:        Intake/Output Summary (Last 24 hours) at 04/13/16 1441 Last data filed at 04/13/16 1347  Gross per 24 hour  Intake              510 ml  Output                0 ml  Net              510 ml   Filed Weights   04/11/16 1520  Weight: (!) 160.6 kg (354 lb)    Exam:  General appearance :Awake, alert, not in any distress. Cooperative.  Speech Clear. Nontoxic appearing. She is morbidly obese HEENT: Atraumatic and Normocephalic, pupils equally reactive to light and accomodation. Neck: supple, no JVD. No cervical lymphadenopathy.  Chest:Good air entry bilaterally, no added sounds  CVS: S1 S2 regular, no murmurs.  Abdomen: Bowel sounds present, Non tender and not distended with no gaurding, rigidity or rebound. Extremities: B/L BKA stump without any ulceration  Neurology:  Non focal Psychiatric: Normal judgment and insight. Alert and oriented x 3. Normal mood. Skin:No Rash  Data Reviewed: Basic Metabolic Panel:  Recent Labs Lab 04/11/16 1611 04/12/16 0543  NA 133* 135  K 5.2* 4.9  CL 106 108  CO2 20* 20*  GLUCOSE 184* 162*  BUN 53* 55*  CREATININE 3.68* 3.66*  CALCIUM 8.3* 8.1*   Liver Function Tests: No results for input(s): AST, ALT, ALKPHOS, BILITOT, PROT, ALBUMIN in the last 168 hours. No results for input(s): LIPASE, AMYLASE in the last 168 hours. No results for input(s): AMMONIA in the last 168 hours. CBC:  Recent Labs Lab 04/11/16 1611 04/12/16 0543  WBC 6.6 6.8  HGB 8.6* 7.9*  HCT 27.2* 25.3*  MCV 82.9 83.0  PLT 317 300   Cardiac Enzymes: No results for input(s): CKTOTAL, CKMB, CKMBINDEX, TROPONINI in the last 168 hours. CBG (last 3)   Recent Labs  04/12/16 2051 04/13/16 0718 04/13/16 1133  GLUCAP 160* 137* 158*   Recent Results (from the past 240 hour(s))  MRSA PCR Screening     Status: None   Collection Time: 04/11/16  3:18 PM  Result Value Ref Range Status   MRSA by  PCR NEGATIVE NEGATIVE Final    Comment:        The GeneXpert MRSA Assay (FDA approved for NASAL specimens only), is one component of a comprehensive MRSA colonization surveillance program. It is not intended to diagnose MRSA infection nor to guide or monitor treatment for MRSA infections.      Studies: No results found.   Scheduled Meds: . aspirin EC  81 mg Oral Daily  . FLUoxetine  20 mg Oral Daily  . fluticasone  1 spray Each Nare BID  . heparin  5,000 Units Subcutaneous Q8H  . hydrALAZINE  25 mg Oral Q8H  . insulin aspart  0-15 Units Subcutaneous TID WC  . insulin detemir  10 Units Subcutaneous BID  . loratadine  10 mg Oral Daily  . metoprolol tartrate  25 mg Oral BID  . pantoprazole  40 mg Oral Daily  . pregabalin  25 mg Oral QHS  . QUEtiapine  25 mg Oral TID  .  sodium chloride flush  3 mL Intravenous Q12H   Continuous Infusions:   Active Problems:   Chest pain   Depression   CKD (chronic kidney disease) stage 4, GFR 15-29 ml/min (HCC)   HTN (hypertension)   DM coma, type 2 (HCC)   S/P bilateral BKA (below knee amputation) ([place])   Peripheral neuropathy ([place])   Morbid obesity ([place])   Time spent:   Irwin Brakeman, MD, FAAFP [facility] Pager [phone]  If 7PM-7AM, please contact night-coverage [email or web address] Password [email or web address] 04/13/2016, 2:41 PM    LOS: 2 days

## 2016-04-14 LAB — GLUCOSE, CAPILLARY
GLUCOSE-CAPILLARY: 171 mg/dL — AB (ref 65–99)
GLUCOSE-CAPILLARY: 246 mg/dL — AB (ref 65–99)
Glucose-Capillary: 148 mg/dL — ABNORMAL HIGH (ref 65–99)
Glucose-Capillary: 158 mg/dL — ABNORMAL HIGH (ref 65–99)
Glucose-Capillary: 128 mg/dL — ABNORMAL HIGH (ref 65–99)

## 2016-04-14 NOTE — Progress Notes (Signed)
Assumed care of patient at 15:00, agree with previous shift assessment. Pt in no acute distress.  Barbee Shropshire. Brigitte Pulse, RN

## 2016-04-14 NOTE — Evaluation (Signed)
Physical Therapy Evaluation Patient Details Name: [name] MRN: [id number] DOB: 12-03-1965 Today's Date: 04/14/2016   History of Present Illness  Pt is admitted here for face to face meeting with psychiatrist medical history significant of of type 2 diabetes, hypertension, bilateral BKA, and occasional noncompliance prior to this admission, depression/bipolar disorder with prior suicidal attempt   Clinical Impression  Obese patient with bilateral BKA is able to follow commands for exercise and mobility will benefit from continued PT at SNF for general strengthening and transfer training to her power chair so that she can become more independent and decrease burden of care for caregivers     Follow Up Recommendations SNF    Equipment Recommendations  None recommended by PT    Recommendations for Other Services OT consult     Precautions / Restrictions Precautions Precautions: Fall      Mobility  Bed Mobility Overal bed mobility: Needs Assistance Bed Mobility: Rolling;Supine to Sit Rolling: Mod assist   Supine to sit: Mod assist (sat in long sitting in bed )     General bed mobility comments: pt initiates using bedrails to roll in bed and is able to assist with pulling up in bed when in [place]   Transfers                 General transfer comment: recommend use of mechanical lift for now.  Will need to have bed/chair at equal height to practice transfers   Ambulation/Gait                Stairs            Wheelchair Mobility    Modified Rankin (Stroke Patients Only)       Balance Overall balance assessment: Modified Independent (uses bedrails )                                           Pertinent Vitals/Pain Pain Assessment: Faces Faces Pain Scale: Hurts a little bit Pain Location: right leg  Pain Descriptors / Indicators: Sore Pain Intervention(s): Repositioned    Home Living Family/patient expects to be  discharged to:: Skilled nursing facility (pt hopes to move right leg better/get back into her chair)                 Additional Comments: pt says she got a new power wheelchair last year and hasn't used it much     Prior Function Level of Independence: Needs assistance               Hand Dominance        Extremity/Trunk Assessment   Upper Extremity Assessment: Overall WFL for tasks assessed;RUE deficits/detail (appears to have less strength in right arm compared to left) RUE Deficits / Details: IV in right thumb.  Pt states she can't move right arm as well because she has had so many IVs in her right arm          Lower Extremity Assessment: RLE deficits/detail;LLE deficits/detail RLE Deficits / Details: Large BKA limb with no open areas, full ROM . strength is about 3/5, but she has less strength than LLE  LLE Deficits / Details: large BKA limb with no open areas ROM is full and strength is about 3+/5      Communication   Communication: No difficulties  Cognition Arousal/Alertness: Awake/alert Behavior During Therapy: WFL for tasks  assessed/performed Overall Cognitive Status: Within Functional Limits for tasks assessed (followed directions well and initiated movment )                      General Comments General comments (skin integrity, edema, etc.): pt with generalized obesity and is having more difficulty moving right arm and leg , but is able to move them against gravity. She was able to complete several reps of AROM with all 4 extremities in different planes     Exercises        Assessment/Plan    PT Assessment    PT Diagnosis Generalized weakness   PT Problem List    PT Treatment Interventions     PT Goals (Current goals can be found in the Care Plan section) Acute Rehab PT Goals Patient Stated Goal: to get her right leg stronger and to use her power chair  PT Goal Formulation: With patient Time For Goal Achievement: 04/28/16 Potential to  Achieve Goals: Good    Frequency     Barriers to discharge        Co-evaluation               End of Session   Activity Tolerance: Patient tolerated treatment well             Time: 1110-1145 PT Time Calculation (min) (ACUTE ONLY): 35 min   Charges:   PT Evaluation $PT Eval Low Complexity: 1 Procedure     PT G Codes:       Teresa K. Owens Shark, PT  Norwood Levo 04/14/2016, 12:13 PM

## 2016-04-14 NOTE — Progress Notes (Signed)
PROGRESS NOTE   [name]  [id number]  DOB: [birth year]  DOA: 04/11/2016 PCP: No primary care provider on file. Outpatient Specialists:   Hospital course: [name] is a 50 y.o. female with medical history significant of of type 2 diabetes, hypertension, bilateral BKA, and occasional noncompliance prior to this admission, depression/bipolar disorder with prior suicidal attempt who initially was admitted to [facility] on 04/01/16 for evaluation of chest pain during that visit she also claimed that she had suicidal ideation by overdosing on multiple medications. She was subsequently admitted to [facility], cardiac enzymes remained and negative. Cipro course was complicated by development of uncontrolled hypertension for which she was treated with multiple antihypertensives and a UTI for which she required intravenous antibiotics. She was followed by telemetry psychiatry at [facility], initially recommendations were to transfer to an inpatient psychiatric facility, psychiatry recommended that the patient be transferred to [facility] for face-to-face evaluation by a psychiatrist.   Assessment & Plan:  . Chest pain: EKG and enzymes were negative at [facility] was admitted [facility] on 7/23 for this primary reason. I do not think she requires any further workup at this point, she does have advanced chronic kidney disease which limits his ability to do invasive procedures. Continue treating with aspirin.   . Suicidal ideation: Continues to complain of suicidal ideation-she claims that she was hospitalized a few years back at [facility] for similar issues. She does have disposition problems on discharge as well that may be contributing to her stress. We will get psychiatry evaluation. Continue sitter at bedside.  Further management pending psychiatric evaluation. They have increased her fluoxetine to 20 mg.  They have recommended SNF placement. They recommend  continued Air cabin crew.    . Depression: Continue Seroquel for mood fluctuations and fluoxetine.  . CKD (chronic kidney disease) stage 4, GFR 15-29 ml/min: Apparently patient does have a creatinine of around 3.5 during her stay at [facility], not sure what her usual baseline is. But given her noncompliance to medications, it is highly likely that she probably has either hypertensive nephrosclerosis or diabetic nephropathy. Continue to monitor renal function. Pt says she has never seen a nephrologist.  Would schedule outpatient appointment with [facility] at discharge.  Pt says that she would never consider dialysis even if it means death but given her current severe depression she could change her feelings about this after treatment.   Marland Kitchen HTN (hypertension): Controlled at this time.   Continue current meds.    . DM, type 2 : Probably was noncompliant to any of her oral hypoglycemic medications prior to her most recent hospitalization at [facility]. For now place on SSI follow CBGs.   CBG (last 3)   Recent Labs  04/13/16 1632 04/13/16 2127 04/14/16 0730  GLUCAP 166* 195* 171*   . Morbid obesity ([place]): Counseled regarding importance of weight loss  . Homelessness: Claims that she is not able to go back to her daughter's place where she was living for the past 1 year.  [name] consulted to help with placement.     CONSULTS: Psychiatry (Called on admission) [name] Care Manager  DVT Prophylaxis: Prophylactic  Heparin  Code Status: Full Code  Disposition Plan: SNF.  Pt is medically stable for transfer at this time.   Subjective: Pt reports ongoing severe depression and suicidal thoughts, sitter present at bedside  Objective: Vitals:   04/13/16 0500 04/13/16 1343 04/13/16 1348 04/14/16 0544  BP: (!) 110/51 (!) 144/74 Marland Kitchen)  144/74 139/69  Pulse: 63  62 89  Resp: 17  18 20   Temp: 98.4 F (36.9 C)  98.5 F (36.9 C) 98.3 F (36.8 C)  TempSrc: Axillary   Oral Oral  SpO2: 96%  95% 96%  Weight:      Height:        Intake/Output Summary (Last 24 hours) at 04/14/16 1012 Last data filed at 04/14/16 0800  Gross per 24 hour  Intake              720 ml  Output                0 ml  Net              720 ml   Filed Weights   04/11/16 1520  Weight: (!) 160.6 kg (354 lb)   Exam:  General appearance :Awake, alert, not in any distress. Cooperative.  Speech Clear. Nontoxic appearing. She is morbidly obese HEENT: Atraumatic and Normocephalic, pupils equally reactive to light and accomodation. Neck: supple, no JVD. No cervical lymphadenopathy.  Chest:Good air entry bilaterally, no added sounds  CVS: S1 S2 regular, no murmurs.  Abdomen: Bowel sounds present, Non tender and not distended with no gaurding, rigidity or rebound. Extremities: B/L BKA stump without any ulceration  Neurology:  Non focal Psychiatric: Normal judgment and insight. Alert and oriented x 3. Normal mood. Skin:No Rash  Data Reviewed: Basic Metabolic Panel:  Recent Labs Lab 04/11/16 1611 04/12/16 0543  NA 133* 135  K 5.2* 4.9  CL 106 108  CO2 20* 20*  GLUCOSE 184* 162*  BUN 53* 55*  CREATININE 3.68* 3.66*  CALCIUM 8.3* 8.1*   Liver Function Tests: No results for input(s): AST, ALT, ALKPHOS, BILITOT, PROT, ALBUMIN in the last 168 hours. No results for input(s): LIPASE, AMYLASE in the last 168 hours. No results for input(s): AMMONIA in the last 168 hours. CBC:  Recent Labs Lab 04/11/16 1611 04/12/16 0543  WBC 6.6 6.8  HGB 8.6* 7.9*  HCT 27.2* 25.3*  MCV 82.9 83.0  PLT 317 300   Cardiac Enzymes: No results for input(s): CKTOTAL, CKMB, CKMBINDEX, TROPONINI in the last 168 hours. CBG (last 3)   Recent Labs  04/13/16 1632 04/13/16 2127 04/14/16 0730  GLUCAP 166* 195* 171*   Recent Results (from the past 240 hour(s))  MRSA PCR Screening     Status: None   Collection Time: 04/11/16  3:18 PM  Result Value Ref Range Status   MRSA by PCR NEGATIVE  NEGATIVE Final    Comment:        The GeneXpert MRSA Assay (FDA approved for NASAL specimens only), is one component of a comprehensive MRSA colonization surveillance program. It is not intended to diagnose MRSA infection nor to guide or monitor treatment for MRSA infections.      Studies: No results found.   Scheduled Meds: . aspirin EC  81 mg Oral Daily  . FLUoxetine  20 mg Oral Daily  . fluticasone  1 spray Each Nare BID  . heparin  5,000 Units Subcutaneous Q8H  . hydrALAZINE  25 mg Oral Q8H  . insulin aspart  0-15 Units Subcutaneous TID WC  . insulin detemir  10 Units Subcutaneous BID  . loratadine  10 mg Oral Daily  . metoprolol tartrate  25 mg Oral BID  . pantoprazole  40 mg Oral Daily  . pregabalin  25 mg Oral QHS  . QUEtiapine  25 mg Oral TID  . sodium  chloride flush  3 mL Intravenous Q12H   Continuous Infusions:   Active Problems:   Chest pain   Depression   CKD (chronic kidney disease) stage 4, GFR 15-29 ml/min (HCC)   HTN (hypertension)   DM coma, type 2 (HCC)   S/P bilateral BKA (below knee amputation) ([place])   Peripheral neuropathy ([place])   Morbid obesity ([place])  Time spent:   Irwin Brakeman, MD, FAAFP [facility] Pager [phone]  If 7PM-7AM, please contact night-coverage [email or web address] Password [email or web address] 04/14/2016, 10:12 AM    LOS: 3 days

## 2016-04-15 LAB — GLUCOSE, CAPILLARY
GLUCOSE-CAPILLARY: 161 mg/dL — AB (ref 65–99)
GLUCOSE-CAPILLARY: 133 mg/dL — AB (ref 65–99)
Glucose-Capillary: 164 mg/dL — ABNORMAL HIGH (ref 65–99)
Glucose-Capillary: 164 mg/dL — ABNORMAL HIGH (ref 65–99)

## 2016-04-15 NOTE — Progress Notes (Signed)
PROGRESS NOTE   [name]  [id number]  DOB: 01-03-1966  DOA: 04/11/2016 PCP: No primary care provider on file. Outpatient Specialists:   Hospital course: [name] is a 50 y.o. female with medical history significant of of type 2 diabetes, hypertension, bilateral BKA, and occasional noncompliance prior to this admission, depression/bipolar disorder with prior suicidal attempt who initially was admitted to [facility] on 04/01/16 for evaluation of chest pain during that visit she also claimed that she had suicidal ideation by overdosing on multiple medications. She was subsequently admitted to [facility], cardiac enzymes remained and negative. Cipro course was complicated by development of uncontrolled hypertension for which she was treated with multiple antihypertensives and a UTI for which she required intravenous antibiotics. She was followed by telemetry psychiatry at [facility], initially recommendations were to transfer to an inpatient psychiatric facility, psychiatry recommended that the patient be transferred to [facility] for face-to-face evaluation by a psychiatrist.   Assessment & Plan:  . Chest pain: EKG and enzymes were negative at [facility] was admitted [facility] on 7/23 for this primary reason. I do not think she requires any further workup at this point, she does have advanced chronic kidney disease which limits his ability to do invasive procedures. Continue treating with aspirin.   . Suicidal ideation: Continues to complain of suicidal ideation-she claims that she was hospitalized a few years back at [facility] for similar issues. She does have disposition problems on discharge as well that may be contributing to her stress. We will get psychiatry evaluation. Continue sitter at bedside.  Further management pending psychiatric evaluation. They have increased her fluoxetine to 20 mg.  They have recommended SNF placement. They recommend  continued Air cabin crew.    . Depression: Continue Seroquel for mood fluctuations and fluoxetine.  . CKD (chronic kidney disease) stage 4, GFR 15-29 ml/min: Apparently patient does have a creatinine of around 3.5 during her stay at [facility], not sure what her usual baseline is. But given her noncompliance to medications, it is highly likely that she probably has either hypertensive nephrosclerosis or diabetic nephropathy. Continue to monitor renal function. Pt says she has never seen a nephrologist.  Would schedule outpatient appointment with [facility] at discharge.  Pt says that she would never consider dialysis even if it means death but given her current severe depression she could change her feelings about this after treatment.   Marland Kitchen HTN (hypertension): Controlled at this time.   Continue current meds.    . DM, type 2 : Probably was noncompliant to any of her oral hypoglycemic medications prior to her most recent hospitalization at [facility]. For now place on SSI follow CBGs.   CBG (last 3)   Recent Labs  04/14/16 2019 04/14/16 2246 04/15/16 0737  GLUCAP 158* 128* 164*   . Morbid obesity ([place]): Counseled regarding importance of weight loss  . Homelessness: Claims that she is not able to go back to her daughter's place where she was living for the past 1 year.  [name] consulted to help with placement.  PT recommending SNF.     CONSULTS: Psychiatry (Called on admission) [name] Care Manager  DVT Prophylaxis: Prophylactic  Heparin  Code Status: Full Code  Disposition Plan: SNF.  Pt is medically stable for transfer at this time.   Subjective: Pt was able to get out of bed to chair yesterday.   Objective: Vitals:   04/14/16 1323 04/14/16 2020 04/14/16 2235 04/15/16 0533  BP: Marland Kitchen)  144/84 136/74 137/74 136/72  Pulse:  61  (!) 58  Resp: 20 20  16   Temp: 98.4 F (36.9 C) 99 F (37.2 C)  99.6 F (37.6 C)  TempSrc: Oral Oral  Axillary  SpO2: 95%  95%  96%  Weight:      Height:        Intake/Output Summary (Last 24 hours) at 04/15/16 0915 Last data filed at 04/15/16 [id number]  Gross per 24 hour  Intake             3095 ml  Output                0 ml  Net             3095 ml   Filed Weights   04/11/16 1520  Weight: (!) 160.6 kg (354 lb)   Exam:  General appearance :Awake, alert, not in any distress. Cooperative.  Speech Clear. Nontoxic appearing. She is morbidly obese HEENT: Atraumatic and Normocephalic, pupils equally reactive to light and accomodation. Neck: supple, no JVD. No cervical lymphadenopathy.  Chest:Good air entry bilaterally, no added sounds  CVS: S1 S2 regular, no murmurs.  Abdomen: Bowel sounds present, Non tender and not distended with no gaurding, rigidity or rebound. Extremities: B/L BKA stump without any ulceration  Neurology:  Non focal Psychiatric: Normal judgment and insight. Alert and oriented x 3. Normal mood. Skin:No Rash  Data Reviewed: Basic Metabolic Panel:  Recent Labs Lab 04/11/16 1611 04/12/16 0543  NA 133* 135  K 5.2* 4.9  CL 106 108  CO2 20* 20*  GLUCOSE 184* 162*  BUN 53* 55*  CREATININE 3.68* 3.66*  CALCIUM 8.3* 8.1*   Liver Function Tests: No results for input(s): AST, ALT, ALKPHOS, BILITOT, PROT, ALBUMIN in the last 168 hours. No results for input(s): LIPASE, AMYLASE in the last 168 hours. No results for input(s): AMMONIA in the last 168 hours. CBC:  Recent Labs Lab 04/11/16 1611 04/12/16 0543  WBC 6.6 6.8  HGB 8.6* 7.9*  HCT 27.2* 25.3*  MCV 82.9 83.0  PLT 317 300   Cardiac Enzymes: No results for input(s): CKTOTAL, CKMB, CKMBINDEX, TROPONINI in the last 168 hours. CBG (last 3)   Recent Labs  04/14/16 2019 04/14/16 2246 04/15/16 0737  GLUCAP 158* 128* 164*   Recent Results (from the past 240 hour(s))  MRSA PCR Screening     Status: None   Collection Time: 04/11/16  3:18 PM  Result Value Ref Range Status   MRSA by PCR NEGATIVE NEGATIVE Final    Comment:         The GeneXpert MRSA Assay (FDA approved for NASAL specimens only), is one component of a comprehensive MRSA colonization surveillance program. It is not intended to diagnose MRSA infection nor to guide or monitor treatment for MRSA infections.      Studies: No results found.   Scheduled Meds: . aspirin EC  81 mg Oral Daily  . FLUoxetine  20 mg Oral Daily  . fluticasone  1 spray Each Nare BID  . heparin  5,000 Units Subcutaneous Q8H  . hydrALAZINE  25 mg Oral Q8H  . insulin aspart  0-15 Units Subcutaneous TID WC  . insulin detemir  10 Units Subcutaneous BID  . loratadine  10 mg Oral Daily  . metoprolol tartrate  25 mg Oral BID  . pantoprazole  40 mg Oral Daily  . pregabalin  25 mg Oral QHS  . QUEtiapine  25 mg Oral TID  .  sodium chloride flush  3 mL Intravenous Q12H   Continuous Infusions:   Active Problems:   Chest pain   Depression   CKD (chronic kidney disease) stage 4, GFR 15-29 ml/min (HCC)   HTN (hypertension)   DM coma, type 2 (HCC)   S/P bilateral BKA (below knee amputation) ([place])   Peripheral neuropathy ([place])   Morbid obesity ([place])  Time spent:   Irwin Brakeman, MD, FAAFP [facility] Pager [phone]  If 7PM-7AM, please contact night-coverage [email or web address] Password [email or web address] 04/15/2016, 9:15 AM    LOS: 4 days

## 2016-04-16 LAB — GLUCOSE, CAPILLARY
GLUCOSE-CAPILLARY: 170 mg/dL — AB (ref 65–99)
GLUCOSE-CAPILLARY: 130 mg/dL — AB (ref 65–99)
Glucose-Capillary: 166 mg/dL — ABNORMAL HIGH (ref 65–99)
Glucose-Capillary: 165 mg/dL — ABNORMAL HIGH (ref 65–99)

## 2016-04-16 NOTE — Clinical Social Work Psych Note (Addendum)
Clinical Social Worker Psych Service Line Progress Note  Clinical Social Worker: Lia Hopping, [name] Date/Time: 04/16/2016, 2:01 PM   Review of Patient  Overall Medical Condition:  Medically Stable  Participation Level:   Minimal  Participation Quality:  Minimal Other Participation Quality:    Affect:  Flat Cognitive:  Oriented X4 Reaction to Medications/Concerns: Patient reports she is feeling better with the medication. No concerns.   Modes of Intervention:  Patient reports she is depressed due to her status and living condition. Patient medication has been managed. Patient is receptive to go to SNF at this time.    Summary of Progress/Plan at Discharge  Summary of Progress/Plan at Discharge: Patient was agreeable to assessment with psychiatrist. Patient reports she is agreeable to SNF at this time. Patient reports she is not ready to go because she is "not ready to meet new people."LCSWA and Psych. Encouraged patient the new people will help take care of her and be supportive of her. Patient reports, she has felt comfortable here at the hospital even though she was angry about not getting her food tray Saturday. Patient reports she does not have any concerns at this time.She reports she will be ready to go to SNF.  Patient has not been able to contact family.   Plan of Discharge: Patient has been cleared by Psychiatrist MD. Kennon Holter has been discharged. Patient will need to be 24 hours without a sitter.  Casey contacted [employer] [facility] [employer]. She will review patient notes and recommendations for possible placement.  LCSWA will continue search other SNF's.  Continue to attempt contact with family.

## 2016-04-16 NOTE — Progress Notes (Signed)
PROGRESS NOTE   [name]  [id number]  DOB: 1966/03/20  DOA: 04/11/2016 PCP: No primary care provider on file. Outpatient Specialists:   Hospital course: [name] is a 50 y.o. female with medical history significant of of type 2 diabetes, hypertension, bilateral BKA, and occasional noncompliance prior to this admission, depression/bipolar disorder with prior suicidal attempt who initially was admitted to [facility] on 04/01/16 for evaluation of chest pain during that visit she also claimed that she had suicidal ideation by overdosing on multiple medications. She was subsequently admitted to [facility], cardiac enzymes remained and negative. Cipro course was complicated by development of uncontrolled hypertension for which she was treated with multiple antihypertensives and a UTI for which she required intravenous antibiotics. She was followed by telemetry psychiatry at [facility], initially recommendations were to transfer to an inpatient psychiatric facility, psychiatry recommended that the patient be transferred to [facility] for face-to-face evaluation by a psychiatrist.   Assessment & Plan:  . Chest pain: EKG and enzymes were negative at [facility] was admitted [facility] on 7/23 for this primary reason. I do not think she requires any further workup at this point, she does have advanced chronic kidney disease which limits his ability to do invasive procedures. Continue treating with aspirin.   . Suicidal ideation: Continues to complain of suicidal ideation-she claims that she was hospitalized a few years back at [facility] for similar issues. She does have disposition problems on discharge as well that may be contributing to her stress. We will get psychiatry evaluation. Continue sitter at bedside.  Further management pending psychiatric evaluation. They have increased her fluoxetine to 20 mg.  They have recommended SNF placement. They recommend  continued Air cabin crew.   Reconsulted psychiatry 8/7 to assist with disposition.   . Depression: Continue Seroquel for mood fluctuations and fluoxetine.  . CKD (chronic kidney disease) stage 4, GFR 15-29 ml/min: Apparently patient does have a creatinine of around 3.5 during her stay at [facility], not sure what her usual baseline is. But given her noncompliance to medications, it is highly likely that she probably has either hypertensive nephrosclerosis or diabetic nephropathy. Continue to monitor renal function. Pt says she has never seen a nephrologist.  Would schedule outpatient appointment with [facility] at discharge.  Pt says that she would never consider dialysis even if it means death but given her current severe depression she could change her feelings about this after treatment.   Marland Kitchen HTN (hypertension): Controlled at this time.   Continue current meds.    . DM, type 2 : Probably was noncompliant to any of her oral hypoglycemic medications prior to her most recent hospitalization at [facility]. For now place on SSI follow CBGs.   CBG (last 3)   Recent Labs  04/15/16 1633 04/15/16 2133 04/16/16 0743  GLUCAP 133* 164* 166*   . Morbid obesity ([place]): Counseled regarding importance of weight loss  . Homelessness: Claims that she is not able to go back to her daughter's place where she was living for the past 1 year.  [name] consulted to help with placement.  PT recommending SNF.     CONSULTS: Psychiatry (Called on admission) [name] Care Manager  DVT Prophylaxis: Prophylactic  Heparin  Code Status: Full Code  Disposition Plan: SNF.  Pt is medically stable for transfer at this time.   Subjective: Pt was able to get out of bed to chair but still having suicidal thoughts but feels a little better.  Objective: Vitals:   04/15/16 1026 04/15/16 1400 04/15/16 2129 04/16/16 0642  BP:  (!) 149/96 132/76 114/65  Pulse: 63 60 62 (!) 57  Resp:  19 18 19     Temp:  98.9 F (37.2 C) 98.2 F (36.8 C) 98.3 F (36.8 C)  TempSrc:  Axillary Axillary Oral  SpO2:  94% 96% 98%  Weight:      Height:        Intake/Output Summary (Last 24 hours) at 04/16/16 1146 Last data filed at 04/16/16 0808  Gross per 24 hour  Intake              840 ml  Output                0 ml  Net              840 ml   Filed Weights   04/11/16 1520  Weight: (!) 160.6 kg (354 lb)   Exam:  General appearance :Awake, alert, not in any distress. Cooperative.  Speech Clear. Nontoxic appearing. She is morbidly obese HEENT: Atraumatic and Normocephalic, pupils equally reactive to light and accomodation. Neck: supple, no JVD. No cervical lymphadenopathy.  Chest:Good air entry bilaterally, no added sounds  CVS: S1 S2 regular, no murmurs.  Abdomen: Bowel sounds present, Non tender and not distended with no gaurding, rigidity or rebound. Extremities: B/L BKA stump without any ulceration  Neurology:  Non focal Psychiatric: Normal judgment and insight. Alert and oriented x 3. Normal mood. Skin:No Rash  Data Reviewed: Basic Metabolic Panel:  Recent Labs Lab 04/11/16 1611 04/12/16 0543  NA 133* 135  K 5.2* 4.9  CL 106 108  CO2 20* 20*  GLUCOSE 184* 162*  BUN 53* 55*  CREATININE 3.68* 3.66*  CALCIUM 8.3* 8.1*   Liver Function Tests: No results for input(s): AST, ALT, ALKPHOS, BILITOT, PROT, ALBUMIN in the last 168 hours. No results for input(s): LIPASE, AMYLASE in the last 168 hours. No results for input(s): AMMONIA in the last 168 hours. CBC:  Recent Labs Lab 04/11/16 1611 04/12/16 0543  WBC 6.6 6.8  HGB 8.6* 7.9*  HCT 27.2* 25.3*  MCV 82.9 83.0  PLT 317 300   Cardiac Enzymes: No results for input(s): CKTOTAL, CKMB, CKMBINDEX, TROPONINI in the last 168 hours. CBG (last 3)   Recent Labs  04/15/16 1633 04/15/16 2133 04/16/16 0743  GLUCAP 133* 164* 166*   Recent Results (from the past 240 hour(s))  MRSA PCR Screening     Status: None    Collection Time: 04/11/16  3:18 PM  Result Value Ref Range Status   MRSA by PCR NEGATIVE NEGATIVE Final    Comment:        The GeneXpert MRSA Assay (FDA approved for NASAL specimens only), is one component of a comprehensive MRSA colonization surveillance program. It is not intended to diagnose MRSA infection nor to guide or monitor treatment for MRSA infections.      Studies: No results found.   Scheduled Meds: . aspirin EC  81 mg Oral Daily  . FLUoxetine  20 mg Oral Daily  . fluticasone  1 spray Each Nare BID  . heparin  5,000 Units Subcutaneous Q8H  . hydrALAZINE  25 mg Oral Q8H  . insulin aspart  0-15 Units Subcutaneous TID WC  . insulin detemir  10 Units Subcutaneous BID  . loratadine  10 mg Oral Daily  . metoprolol tartrate  25 mg Oral BID  . pantoprazole  40 mg Oral Daily  .  pregabalin  25 mg Oral QHS  . QUEtiapine  25 mg Oral TID  . sodium chloride flush  3 mL Intravenous Q12H   Continuous Infusions:   Active Problems:   Chest pain   Depression   CKD (chronic kidney disease) stage 4, GFR 15-29 ml/min (HCC)   HTN (hypertension)   DM coma, type 2 (HCC)   S/P bilateral BKA (below knee amputation) ([place])   Peripheral neuropathy ([place])   Morbid obesity ([place])  Time spent:   Irwin Brakeman, MD, FAAFP [facility] Pager [phone]  If 7PM-7AM, please contact night-coverage [email or web address] Password [email or web address] 04/16/2016, 11:46 AM    LOS: 5 days

## 2016-04-16 NOTE — Consult Note (Signed)
[place] [facility]iat[facility] Consult   Reason for Consult:  Depression and suicide ideation (passive) Referring Physician:  Dr. Wynetta Emery Patient Identification: [name] MRN:  [id number] Principal Diagnosis: <principal problem not specified> Diagnosis:   Patient Active Problem List   Diagnosis Date Noted  . Chest pain [R07.9] 04/11/2016  . Depression [F32.9] 04/11/2016  . CKD (chronic kidney disease) stage 4, GFR 15-29 ml/min (HCC) [N18.4] 04/11/2016  . HTN (hypertension) [I10] 04/11/2016  . DM coma, type 2 ([place]) [E11.69] 04/11/2016  . S/P bilateral BKA (below knee amputation) ([place]) [id number], Z89.511] 04/11/2016  . Peripheral neuropathy ([place]) [G62.9] 04/11/2016  . Morbid obesity ([place]) [E66.01] 04/11/2016    Total Time spent with patient: 1 hour  Subjective:   [name] is a 50 y.o. female patient admitted with depression and passive suicide ideation.  HPI:  [name] is a 50 y.o. female with medical history significant of of type 2 diabetes, hypertension, bilateral BKA, and occasional noncompliance prior to this admission, depression/bipolar disorder with prior suicidal attempt who initially was admitted to [facility] on 04/01/16 for evaluation of chest pain. Patient seen face-to-face for this psychiatric consultation and evaluation of increased symptoms of depression and suicidal ideation previously has expressed suicidal plan of intentional overdose of medications. Patient reported she cannot tell how she is going to harm herself today. Patient stated at the same time, "she does not want to die and refused hospice care". Patient endorses depression most situational. Patient has similar symptoms of depression and suicidal ideation 2 years ago before being placed in a skilled nursing facility in [place], [place] from the [facility] emergency department after her boyfriend and boyfriend's mother left her. Patient is also refusing to go to long-term skilled  nursing facility saying that she has been in several facilities in the past and willing to participate short-term skilled nursing facility if possible until she finds a stable place with her daughter, daughter's boyfriend and 2 young grand children. Patient is also reported she wishes to find a boyfriend and has no current relationship since 2 years ago her boyfriend left her. Patient reported her main stress is lack of place to live since her daughter moved out of the house because of financial difficulties. Patient reportedly has a disability income but cannot afford her own house because she needed to pay for her bed and also utilities. Patient denies alcohol abuse and also illicit substance abuse.  She was initially admitted to [facility] for chest pain, cardiac enzymes remained and negative. Cipro course was complicated by development of uncontrolled hypertension for which she was treated with multiple antihypertensives and a UTI for which she required intravenous antibiotics. She was followed by telemetry psychiatry at [facility], initially recommendations were to transfer to an inpatient psychiatric facility, however today psychiatry recommended that the patient be transferred to [facility] for face-to-face evaluation by a psychiatrist.   During my evaluation patient endorses ongoing symptoms of depression and passive suicidal ideation without intention or plan to commit suicide.Patient also reportedly has no previous acute psychiatric hospitalizations her suicidal attempts.  She also acknowledges that she was living with her daughter for the past 1 year in [place], [place], before that she was in a skilled nursing facility in [place], [place]. She unfortunately has no place to go upon discharge has not of her family members will be able to take her in. Based on my evaluation patient has depression which is situational and related to lack of  support from the family, financial  difficulties and place to live.  Interval history: Patient seen today along with [name] for the follow-up psychiatric consultation and evaluation. Patient continued to be depressed and cooperative with medication management, no difficulties with her appetite or sleep. Patient stated that she wants to go back to her daughter but she does not have a place to live at this time. Patient is willing to participate in skilled nursing facility as a brief period until her daughter can find a place to live for her. Patient stated she is not ready to go today and is willing to go sometime this week because she does not want to interact with new people. Patient denies active suicidal/homicidal ideation, intention or plans. Patient has no evidence of psychosis.  Risk to Self: Is patient at risk for suicide?: Yes Risk to Others:   Prior Inpatient Therapy:   Prior Outpatient Therapy:    Past Medical History:  Past Medical History:  Diagnosis Date  . Arthritis   . Asthma   . Diabetes mellitus without complication ([place])   . GERD (gastroesophageal reflux disease)   . Hypertension   . Suicide attempt [facility])     Past Surgical History:  Procedure Laterality Date  . CHOLECYSTECTOMY     Family History:  Family History  Problem Relation Age of Onset  . Diabetes Mother   . Kidney failure Mother   . Diabetes Father   . Hypertension Father    Family Psychiatric  History: Patient denies family history of mental illness.  Social History:  History  Alcohol Use  . Yes    Comment: Occasional     History  Drug Use No    Social History   Social History  . Marital status: Single    Spouse name: N/A  . Number of children: N/A  . Years of education: N/A   Social History Main Topics  . Smoking status: Never Smoker  . Smokeless tobacco: Never Used  . Alcohol use Yes     Comment: Occasional  . Drug use: No  . Sexual activity: Not Asked   Other Topics Concern  . None   Social History Narrative  .  None   Additional Social History:    Allergies:  No Known Allergies  Labs:  Results for orders placed or performed during the hospital encounter of 04/11/16 (from the past 48 hour(s))  Glucose, capillary     Status: Abnormal   Collection Time: 04/14/16  4:43 PM  Result Value Ref Range   Glucose-Capillary 148 (H) 65 - 99 mg/dL  Glucose, capillary     Status: Abnormal   Collection Time: 04/14/16  8:19 PM  Result Value Ref Range   Glucose-Capillary 158 (H) 65 - 99 mg/dL   Comment 1 Notify RN    Comment 2 Document in Chart   Glucose, capillary     Status: Abnormal   Collection Time: 04/14/16 10:46 PM  Result Value Ref Range   Glucose-Capillary 128 (H) 65 - 99 mg/dL   Comment 1 Notify RN    Comment 2 Document in Chart   Glucose, capillary     Status: Abnormal   Collection Time: 04/15/16  7:37 AM  Result Value Ref Range   Glucose-Capillary 164 (H) 65 - 99 mg/dL  Glucose, capillary     Status: Abnormal   Collection Time: 04/15/16 11:42 AM  Result Value Ref Range   Glucose-Capillary 161 (H) 65 - 99 mg/dL  Glucose, capillary  Status: Abnormal   Collection Time: 04/15/16  4:33 PM  Result Value Ref Range   Glucose-Capillary 133 (H) 65 - 99 mg/dL  Glucose, capillary     Status: Abnormal   Collection Time: 04/15/16  9:33 PM  Result Value Ref Range   Glucose-Capillary 164 (H) 65 - 99 mg/dL  Glucose, capillary     Status: Abnormal   Collection Time: 04/16/16  7:43 AM  Result Value Ref Range   Glucose-Capillary 166 (H) 65 - 99 mg/dL  Glucose, capillary     Status: Abnormal   Collection Time: 04/16/16 11:50 AM  Result Value Ref Range   Glucose-Capillary 165 (H) 65 - 99 mg/dL    Current Facility-Administered Medications  Medication Dose Route Frequency Provider Last Rate Last Dose  . 0.9 %  sodium chloride infusion  250 mL Intravenous PRN Jonetta Osgood, MD      . acetaminophen (TYLENOL) tablet 650 mg  650 mg Oral Q6H PRN Shanker Kristeen Mans, MD       Or  . acetaminophen  (TYLENOL) suppository 650 mg  650 mg Rectal Q6H PRN Shanker Kristeen Mans, MD      . albuterol (PROVENTIL) (2.5 MG/3ML) 0.083% nebulizer solution 2.5 mg  2.5 mg Nebulization Q2H PRN Jonetta Osgood, MD      . aspirin EC tablet 81 mg  81 mg Oral Daily Jonetta Osgood, MD   81 mg at 04/16/16 0958  . FLUoxetine (PROZAC) capsule 20 mg  20 mg Oral Daily [place] Marisa Hua, MD   20 mg at 04/16/16 0958  . fluticasone (FLONASE) 50 MCG/ACT nasal spray 1 spray  1 spray Each Nare BID Jonetta Osgood, MD   1 spray at 04/16/16 0958  . heparin injection 5,000 Units  5,000 Units Subcutaneous Q8H Jonetta Osgood, MD   5,000 Units at 04/15/16 2140  . hydrALAZINE (APRESOLINE) injection 10 mg  10 mg Intravenous Q6H PRN Jonetta Osgood, MD      . hydrALAZINE (APRESOLINE) tablet 25 mg  25 mg Oral Q8H Jonetta Osgood, MD   25 mg at 04/15/16 2139  . insulin aspart (novoLOG) injection 0-15 Units  0-15 Units Subcutaneous TID WC Jonetta Osgood, MD   3 Units at 04/16/16 1242  . insulin detemir (LEVEMIR) injection 10 Units  10 Units Subcutaneous BID Jonetta Osgood, MD   10 Units at 04/16/16 [phone]  . loratadine (CLARITIN) tablet 10 mg  10 mg Oral Daily Jonetta Osgood, MD   10 mg at 04/16/16 0958  . metoprolol tartrate (LOPRESSOR) tablet 25 mg  25 mg Oral BID Jonetta Osgood, MD   25 mg at 04/16/16 0958  . ondansetron (ZOFRAN) injection 4 mg  4 mg Intravenous Q6H PRN Shanker Kristeen Mans, MD      . ondansetron [facility]) tablet 4 mg  4 mg Oral Q6H PRN Jonetta Osgood, MD      . pantoprazole (PROTONIX) EC tablet 40 mg  40 mg Oral Daily Jonetta Osgood, MD   40 mg at 04/16/16 0958  . pregabalin (LYRICA) capsule 25 mg  25 mg Oral QHS Jonetta Osgood, MD   25 mg at 04/15/16 2139  . QUEtiapine (SEROQUEL) tablet 25 mg  25 mg Oral TID Jonetta Osgood, MD   25 mg at 04/16/16 0958  . sodium chloride flush (NS) 0.9 % injection 3 mL  3 mL Intravenous Q12H Jonetta Osgood, MD   3 mL at 04/16/16 1000  .  sodium  chloride flush (NS) 0.9 % injection 3 mL  3 mL Intravenous PRN Shanker Kristeen Mans, MD        Musculoskeletal: Strength & Muscle Tone: decreased Gait & Station: Bilateral BKA 2009. Patient leans: N/A  Psychiatric Specialty Exam: Physical Exam as per history and physical   ROS Complaining about depression, anxiety, restlessness, insomnia, hopelessness and worthlessness. No Fever-chills, No Headache, No changes with Vision or hearing, reports vertigo No problems swallowing food or Liquids, No Chest pain, Cough or Shortness of Breath, No Abdominal pain, No Nausea or Vommitting, Bowel movements are regular, No Blood in stool or Urine, No dysuria, No new skin rashes or bruises, No new joints pains-aches,  No new weakness, tingling, numbness in any extremity, No recent weight gain or loss, No polyuria, polydypsia or polyphagia,   A full 10 point Review of Systems was done, except as stated above, all other Review of Systems were negative.  Blood pressure 114/65, pulse (!) 57, temperature 98.3 F (36.8 C), temperature source Oral, resp. rate 19, height 5\' 7"  (1.702 m), weight (!) 160.6 kg (354 lb), last menstrual period 04/11/2008, SpO2 98 %.Body mass index is 55.44 kg/m.  General Appearance: Disheveled and Guarded  Eye Contact:  Good  Speech:  Clear and Coherent and Slow  Volume:  Decreased  Mood:  Anxious and Depressed  Affect:  Constricted and Depressed  Thought Process:  Coherent and Goal Directed  Orientation:  Full (Time, Place, and Person)  Thought Content:  WDL  Suicidal Thoughts:  No  Homicidal Thoughts:  No  Memory:  Immediate;   Fair Recent;   Fair  Judgement:  Fair  Insight:  Fair  Psychomotor Activity:  Decreased  Concentration:  Concentration: Fair and Attention Span: Fair  Recall:  Poor  Fund of Knowledge:  Fair  Language:  Good  Akathisia:  Negative  Handed:  Right  AIMS (if indicated):     Assets:  Communication Skills Desire for Improvement Financial  Resources/Insurance Leisure Time Resilience Social Support  ADL's:  Impaired  Cognition:  Impaired,  Mild  Sleep:        Treatment Plan Summary: Patient admitted with multiple medical problems, psychosocial stresses and depression, anxiety, and denied active suicidal ideation without intention and plan. Based on my clinical impression patient current clinical presentation is secondary to psychosocial situation does not meet criteria for acute psychiatric hospitalization. Patient indicated that she is willing to go to the skilled nursing facility for brief period within few days and feels he is not ready to interact with the new people as of today.  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   Homicidal ideation:  Patient denies homicidal ideation, intention or plans.   Depression:  Fluoxetine 20 mg daily for depression  Mood swings and irritability:  Seroquel 25 mg 3 times daily Discussed case with [name] to contact patient family members regarding collateral information and possibly psychosocial support needs.    Disposition: Patient benefit from short-term/brief skilled nursing facility until her family is able to find a place to live  Supportive therapy provided about ongoing stressors.  Ambrose Finland, MD 04/16/2016 1:11 PM

## 2016-04-17 LAB — GLUCOSE, CAPILLARY
GLUCOSE-CAPILLARY: 134 mg/dL — AB (ref 65–99)
GLUCOSE-CAPILLARY: 157 mg/dL — AB (ref 65–99)
Glucose-Capillary: 165 mg/dL — ABNORMAL HIGH (ref 65–99)

## 2016-04-17 MED ORDER — IPRATROPIUM-ALBUTEROL 0.5-2.5 (3) MG/3ML IN SOLN: 3.0000 mL | RESPIRATORY_TRACT | 0 refills | Status: AC | PRN

## 2016-04-17 MED ORDER — FLUOXETINE HCL 20 MG PO TABS: 20.0000 mg | ORAL_TABLET | Freq: Every day | ORAL | 0 refills | Status: AC

## 2016-04-17 MED ORDER — HYDRALAZINE HCL 25 MG PO TABS: 25.0000 mg | ORAL_TABLET | Freq: Three times a day (TID) | ORAL | 0 refills | Status: AC

## 2016-04-17 NOTE — Discharge Instructions (Signed)
Suicidal Feelings: How to Help Yourself °Suicide is the taking of one's own life. If you feel as though life is getting too tough to handle and are thinking about suicide, get help right away. To get help: °· Call your local emergency services (911 in the [place] °· Call a suicide hotline to speak with a trained counselor who understands how you are feeling. The following is a list of suicide hotlines in the [place]. For a list of hotlines in [place], visit www.suicide.org/hotlines/international/[place]-suicide-hotlines.html. °¨  1-800-273-TALK (1-800-273-8255). °¨  1-800-SUICIDE (1-800-784-2433). °¨  1-888-628-9454. This is a hotline for Spanish speakers. °¨  1-800-799-4TTY (1-800-799-4889). This is a hotline for TTY users. °¨  1-866-4-U-TREVOR (1-866-488-7386). This is a hotline for lesbian, gay, bisexual, transgender, or questioning youth. °· Contact a crisis center or a local suicide prevention center. To find a crisis center or suicide prevention center: °¨ Call your local hospital, clinic, community service organization, mental health center, social service provider, or health department. Ask for assistance in connecting to a crisis center. °¨ Visit www.suicidepreventionlifeline.org/getinvolved/locator for a list of crisis centers in the [place], or visit www.suicideprevention.[place]/thinking-about-suicide/find-a-crisis-centre for a list of centers in [place]. °· Visit the following websites: °¨  National Suicide Prevention Lifeline: www.suicidepreventionlifeline.org °¨  Hopeline: www.hopeline.com °¨  American Foundation for Suicide Prevention: www.afsp.org °¨  The Trevor Project (for lesbian, gay, bisexual, transgender, or questioning youth): www.thetrevorproject.org °HOW CAN I HELP MYSELF FEEL BETTER? °· Promise yourself that you will not do anything drastic when you have suicidal feelings. Remember, there is hope. Many people have gotten through suicidal thoughts and feelings, and you will, too. You may  have gotten through them before, and this proves that you can get through them again. °· Let family, friends, teachers, or counselors know how you are feeling. Try not to isolate yourself from those who care about you. Remember, they will want to help you. Talk with someone every day, even if you do not feel sociable. Face-to-face conversation is best. °· Call a mental health professional and see one regularly. °· Visit your primary health care provider every year. °· Eat a well-balanced diet, and space your meals so you eat regularly. °· Get plenty of rest. °· Avoid alcohol and drugs, and remove them from your home. They will only make you feel worse. °· If you are thinking of taking a lot of medicine, give your medicine to someone who can give it to you one day at a time. If you are on antidepressants and are concerned you will overdose, let your health care provider know so he or she can give you safer medicines. Ask your mental health professional about the possible side effects of any medicines you are taking. °· Remove weapons, poisons, knives, and anything else that could harm you from your home. °· Try to stick to routines. Follow a schedule every day. Put self-care on your schedule. °· Make a list of realistic goals, and cross them off when you achieve them. Accomplishments give a sense of worth. °· Wait until you are feeling better before doing the things you find difficult or unpleasant. °· Exercise if you are able. You will feel better if you exercise for even a half hour each day. °· Go out in the sun or into nature. This will help you recover from depression faster. If you have a favorite place to walk, go there. °· Do the things that have always given you pleasure. Play your favorite music, read a good book, paint a picture, play your favorite instrument, or do anything   else that takes your mind off your depression if it is safe to do.  Keep your living space well lit.  When you are feeling well,  write yourself a letter about tips and support that you can read when you are not feeling well.  Remember that life's difficulties can be sorted out with help. Conditions can be treated. You can work on thoughts and strategies that serve you well.   This information is not intended to replace advice given to you by your health care provider. Make sure you discuss any questions you have with your health care provider.   Document Released: 03/03/2003 Document Revised: 09/17/2014 Document Reviewed: 12/22/2013 Elsevier Interactive Patient Education 2016 [employer]    Chronic Kidney Disease Chronic kidney disease happens when the kidneys are damaged over a long period. The kidneys are two organs that do many important jobs in the body. These jobs include:  Removing wastes and extra fluids from the blood.  Making hormones that help to keep the body healthy.  Making sure that the body has the right amount of fluids and chemicals. Chronic kidney disease may be caused by many things. The kidney damage occurs slowly. If too much damage occurs, the kidneys may stop working the way that they should. This is dangerous. Treatment can help to slow down the damage and keep it from getting worse. HOME CARE  Follow your diet as told by your doctor. You may need to limit the amount of salt (sodium) and protein that you eat each day.  Take medicines only as told by your doctor. Do not take any new medicines unless your doctor approves it.  Quit smoking if you smoke. Talk to your doctor about programs that may help you quit smoking.  Have your blood pressure checked regularly and keep track of the results.  Start or keep doing an exercise plan.  Get shots (immunizations) as told by your doctor.  Take vitamins and minerals as told by your doctor.  Keep all follow-up visits as told by your doctor. This is important. GET HELP RIGHT AWAY IF:   Your symptoms get worse.  You have new  symptoms.  You have symptoms of end-stage kidney disease. These include:  Headaches.  Skin that is darker or lighter than normal.  Numbness in the hands or feet.  Easy bruising.  Frequent hiccups.  Stopping of menstrual periods in women.  You have a fever.  You are making very little pee (urine).  You have pain or bleeding when you pee.   This information is not intended to replace advice given to you by your health care provider. Make sure you discuss any questions you have with your health care provider.   Document Released: 11/21/2009 Document Revised: 05/18/2015 Document Reviewed: 04/25/2012 Elsevier Interactive Patient Education 2016 [employer]  Blood Glucose Monitoring, Adult Monitoring your blood glucose (also know as blood sugar) helps you to manage your diabetes. It also helps you and your health care provider monitor your diabetes and determine how well your treatment plan is working. WHY SHOULD YOU MONITOR YOUR BLOOD GLUCOSE?  It can help you understand how food, exercise, and medicine affect your blood glucose.  It allows you to know what your blood glucose is at any given moment. You can quickly tell if you are having low blood glucose (hypoglycemia) or high blood glucose (hyperglycemia).  It can help you and your health care provider know how to adjust your medicines.  It can help you understand  how to manage an illness or adjust medicine for exercise. WHEN SHOULD YOU TEST? Your health care provider will help you decide how often you should check your blood glucose. This may depend on the type of diabetes you have, your diabetes control, or the types of medicines you are taking. Be sure to write down all of your blood glucose readings so that this information can be reviewed with your health care provider. See below for examples of testing times that your health care provider may suggest. Type 1 Diabetes  Test at least 2 times per day if your diabetes is  well controlled, if you are using an insulin pump, or if you perform multiple daily injections.  If your diabetes is not well controlled or if you are sick, you may need to test more often.  It is a good idea to also test:  Before every insulin injection.  Before and after exercise.  Between meals and 2 hours after a meal.  Occasionally between 2:00 a.m. and 3:00 a.m. Type 2 Diabetes  If you are taking insulin, test at least 2 times per day. However, it is best to test before every insulin injection.  If you take medicines by mouth (orally), test 2 times a day.  If you are on a controlled diet, test once a day.  If your diabetes is not well controlled or if you are sick, you may need to monitor more often. HOW TO MONITOR YOUR BLOOD GLUCOSE Supplies Needed  Blood glucose meter.  Test strips for your meter. Each meter has its own strips. You must use the strips that go with your own meter.  A pricking needle (lancet).  A device that holds the lancet (lancing device).  A journal or log book to write down your results. Procedure  Wash your hands with soap and water. Alcohol is not preferred.  Prick the side of your finger (not the tip) with the lancet.  Gently milk the finger until a small drop of blood appears.  Follow the instructions that come with your meter for inserting the test strip, applying blood to the strip, and using your blood glucose meter. Other Areas to Get Blood for Testing Some meters allow you to use other areas of your body (other than your finger) to test your blood. These areas are called alternative sites. The most common alternative sites are:  The forearm.  The thigh.  The back area of the lower leg.  The palm of the hand. The blood flow in these areas is slower. Therefore, the blood glucose values you get may be delayed, and the numbers are different from what you would get from your fingers. Do not use alternative sites if you think you are  having hypoglycemia. Your reading will not be accurate. Always use a finger if you are having hypoglycemia. Also, if you cannot feel your lows (hypoglycemia unawareness), always use your fingers for your blood glucose checks. ADDITIONAL TIPS FOR GLUCOSE MONITORING  Do not reuse lancets.  Always carry your supplies with you.  All blood glucose meters have a 24-hour "hotline" number to call if you have questions or need help.  Adjust (calibrate) your blood glucose meter with a control solution after finishing a few boxes of strips. BLOOD GLUCOSE RECORD KEEPING It is a good idea to keep a daily record or log of your blood glucose readings. Most glucose meters, if not all, keep your glucose records stored in the meter. Some meters come with the ability  to download your records to your home computer. Keeping a record of your blood glucose readings is especially helpful if you are wanting to look for patterns. Make notes to go along with the blood glucose readings because you might forget what happened at that exact time. Keeping good records helps you and your health care provider to work together to achieve good diabetes management.    This information is not intended to replace advice given to you by your health care provider. Make sure you discuss any questions you have with your health care provider.   Document Released: 08/30/2003 Document Revised: 09/17/2014 Document Reviewed: 01/19/2013 Elsevier Interactive Patient Education [employer].

## 2016-04-17 NOTE — Consult Note (Signed)
[facility] Psychiatry Consult   Reason for Consult:  Depression and suicide ideation (passive) Referring Physician:  Dr. Wynetta Emery Patient Identification: CAMANI TAUBMAN MRN:  [id number] Principal Diagnosis: <principal problem not specified> Diagnosis:   Patient Active Problem List   Diagnosis Date Noted  . Chest pain [R07.9] 04/11/2016  . Depression [F32.9] 04/11/2016  . CKD (chronic kidney disease) stage 4, GFR 15-29 ml/min (HCC) [N18.4] 04/11/2016  . HTN (hypertension) [I10] 04/11/2016  . DM coma, type 2 ([place]) [E11.69] 04/11/2016  . S/P bilateral BKA (below knee amputation) ([place]) [id number], Z89.511] 04/11/2016  . Peripheral neuropathy ([place]) [G62.9] 04/11/2016  . Morbid obesity ([place]) [E66.01] 04/11/2016    Total Time spent with patient: 1 hour  Subjective:   [name] is a 50 y.o. female patient admitted with depression and passive suicide ideation.  HPI:  [name] is a 50 y.o. female with medical history significant of of type 2 diabetes, hypertension, bilateral BKA, and occasional noncompliance prior to this admission, depression/bipolar disorder with prior suicidal attempt who initially was admitted to [facility] on 04/01/16 for evaluation of chest pain. Patient seen face-to-face for this psychiatric consultation and evaluation of increased symptoms of depression and suicidal ideation previously has expressed suicidal plan of intentional overdose of medications. Patient reported she cannot tell how she is going to harm herself today. Patient stated at the same time, "she does not want to die and refused hospice care". Patient endorses depression most situational. Patient has similar symptoms of depression and suicidal ideation 2 years ago before being placed in a skilled nursing facility in [place], [place] from the [facility] emergency department after her boyfriend and boyfriend's mother left her. Patient is also refusing to go to long-term skilled  nursing facility saying that she has been in several facilities in the past and willing to participate short-term skilled nursing facility if possible until she finds a stable place with her daughter, daughter's boyfriend and 2 young grand children. Patient is also reported she wishes to find a boyfriend and has no current relationship since 2 years ago her boyfriend left her. Patient reported her main stress is lack of place to live since her daughter moved out of the house because of financial difficulties. Patient reportedly has a disability income but cannot afford her own house because she needed to pay for her bed and also utilities. Patient denies alcohol abuse and also illicit substance abuse.  She was initially admitted to [facility] for chest pain, cardiac enzymes remained and negative. Cipro course was complicated by development of uncontrolled hypertension for which she was treated with multiple antihypertensives and a UTI for which she required intravenous antibiotics. She was followed by telemetry psychiatry at [facility], initially recommendations were to transfer to an inpatient psychiatric facility, however today psychiatry recommended that the patient be transferred to [facility] for face-to-face evaluation by a psychiatrist.   During my evaluation patient endorses ongoing symptoms of depression and passive suicidal ideation without intention or plan to commit suicide.Patient also reportedly has no previous acute psychiatric hospitalizations her suicidal attempts.  She also acknowledges that she was living with her daughter for the past 1 year in [place], [place], before that she was in a skilled nursing facility in [place], [place]. She unfortunately has no place to go upon discharge has not of her family members will be able to take her in. Based on my evaluation patient has depression which is situational and related to lack  of support from the family, financial  difficulties and place to live.  8/82017 Interval history: Patient seen today along with [name] for the follow-up psychiatric consultation and evaluation. Patient appeared in her bed, wide awake, alert, oriented to time, place and person. She has been watching television show and says she likes to watch that story and seems to enjoying. She has spoken with her aunt on the phone which went well without incident and has no contact with her daughter. She has been feeling much better as she is able to sleep good last night. She is calm and cooperative with hospital treatment and states that she is happy that every one is nice and treating her well. She has been compliant with medications and no side effect noted. She is able eat well all her meals as expected. Patient is willing to be placed at SNF and ask the social service to search in [place] because closer to her family members at this time. Patient is willing to participate in skilled nursing facility as a brief period until her daughter can find a place to live for her. Patient is considered medically and psychiatrically stable and ready for disposition plans. She is no longer have suicide ideation and danger to herself or others. Patient denies active suicidal/homicidal ideation, intention or plans. Patient has no evidence of psychosis.  Risk to Self: Is patient at risk for suicide?: NO Risk to Others:  NO Prior Inpatient Therapy:   Prior Outpatient Therapy:  NO  Past Medical History:  Past Medical History:  Diagnosis Date  . Arthritis   . Asthma   . Diabetes mellitus without complication ([place])   . GERD (gastroesophageal reflux disease)   . Hypertension   . Suicide attempt [facility])     Past Surgical History:  Procedure Laterality Date  . CHOLECYSTECTOMY     Family History:  Family History  Problem Relation Age of Onset  . Diabetes Mother   . Kidney failure Mother   . Diabetes Father   . Hypertension Father    Family Psychiatric   History: Patient denies family history of mental illness.  Social History:  History  Alcohol Use  . Yes    Comment: Occasional     History  Drug Use No    Social History   Social History  . Marital status: Single    Spouse name: N/A  . Number of children: N/A  . Years of education: N/A   Social History Main Topics  . Smoking status: Never Smoker  . Smokeless tobacco: Never Used  . Alcohol use Yes     Comment: Occasional  . Drug use: No  . Sexual activity: Not Asked   Other Topics Concern  . None   Social History Narrative  . None   Additional Social History:    Allergies:  No Known Allergies  Labs:  Results for orders placed or performed during the hospital encounter of 04/11/16 (from the past 48 hour(s))  Glucose, capillary     Status: Abnormal   Collection Time: 04/15/16 11:42 AM  Result Value Ref Range   Glucose-Capillary 161 (H) 65 - 99 mg/dL  Glucose, capillary     Status: Abnormal   Collection Time: 04/15/16  4:33 PM  Result Value Ref Range   Glucose-Capillary 133 (H) 65 - 99 mg/dL  Glucose, capillary     Status: Abnormal   Collection Time: 04/15/16  9:33 PM  Result Value Ref Range   Glucose-Capillary 164 (H) 65 -  99 mg/dL  Glucose, capillary     Status: Abnormal   Collection Time: 04/16/16  7:43 AM  Result Value Ref Range   Glucose-Capillary 166 (H) 65 - 99 mg/dL  Glucose, capillary     Status: Abnormal   Collection Time: 04/16/16 11:50 AM  Result Value Ref Range   Glucose-Capillary 165 (H) 65 - 99 mg/dL  Glucose, capillary     Status: Abnormal   Collection Time: 04/16/16  4:31 PM  Result Value Ref Range   Glucose-Capillary 170 (H) 65 - 99 mg/dL  Glucose, capillary     Status: Abnormal   Collection Time: 04/16/16  9:28 PM  Result Value Ref Range   Glucose-Capillary 130 (H) 65 - 99 mg/dL   Comment 1 Notify RN   Glucose, capillary     Status: Abnormal   Collection Time: 04/17/16  8:24 AM  Result Value Ref Range   Glucose-Capillary 134 (H) 65  - 99 mg/dL    Current Facility-Administered Medications  Medication Dose Route Frequency Provider Last Rate Last Dose  . 0.9 %  sodium chloride infusion  250 mL Intravenous PRN Jonetta Osgood, MD      . acetaminophen (TYLENOL) tablet 650 mg  650 mg Oral Q6H PRN Shanker Kristeen Mans, MD       Or  . acetaminophen (TYLENOL) suppository 650 mg  650 mg Rectal Q6H PRN Shanker Kristeen Mans, MD      . albuterol (PROVENTIL) (2.5 MG/3ML) 0.083% nebulizer solution 2.5 mg  2.5 mg Nebulization Q2H PRN Jonetta Osgood, MD      . aspirin EC tablet 81 mg  81 mg Oral Daily Jonetta Osgood, MD   81 mg at 04/17/16 0948  . FLUoxetine (PROZAC) capsule 20 mg  20 mg Oral Daily Clan[place] Marisa Hua, MD   20 mg at 04/17/16 0948  . fluticasone (FLONASE) 50 MCG/ACT nasal spray 1 spray  1 spray Each Nare BID Jonetta Osgood, MD   1 spray at 04/17/16 0949  . heparin injection 5,000 Units  5,000 Units Subcutaneous Q8H Jonetta Osgood, MD   5,000 Units at 04/17/16 [phone]  . hydrALAZINE (APRESOLINE) injection 10 mg  10 mg Intravenous Q6H PRN Jonetta Osgood, MD      . hydrALAZINE (APRESOLINE) tablet 25 mg  25 mg Oral Q8H Shanker Kristeen Mans, MD   25 mg at 04/17/16 [id number]  . insulin aspart (novoLOG) injection 0-15 Units  0-15 Units Subcutaneous TID WC Jonetta Osgood, MD   2 Units at 04/17/16 [phone]  . insulin detemir (LEVEMIR) injection 10 Units  10 Units Subcutaneous BID Jonetta Osgood, MD   10 Units at 04/17/16 [phone]  . loratadine (CLARITIN) tablet 10 mg  10 mg Oral Daily Jonetta Osgood, MD   10 mg at 04/17/16 0948  . metoprolol tartrate (LOPRESSOR) tablet 25 mg  25 mg Oral BID Jonetta Osgood, MD   25 mg at 04/17/16 0948  . ondansetron (ZOFRAN) injection 4 mg  4 mg Intravenous Q6H PRN Shanker Kristeen Mans, MD      . ondansetron (ZOFRAN) tablet 4 mg  4 mg Oral Q6H PRN Jonetta Osgood, MD      . pantoprazole (PROTONIX) EC tablet 40 mg  40 mg Oral Daily Jonetta Osgood, MD   40 mg at 04/17/16 0948  . pregabalin  (LYRICA) capsule 25 mg  25 mg Oral QHS Jonetta Osgood, MD   25 mg at 04/16/16 2237  . QUEtiapine (SEROQUEL)  tablet 25 mg  25 mg Oral TID Jonetta Osgood, MD   25 mg at 04/17/16 0948  . sodium chloride flush (NS) 0.9 % injection 3 mL  3 mL Intravenous Q12H Jonetta Osgood, MD   3 mL at 04/17/16 1000  . sodium chloride flush (NS) 0.9 % injection 3 mL  3 mL Intravenous PRN Shanker Kristeen Mans, MD        Musculoskeletal: Strength & Muscle Tone: decreased Gait & Station: Bilateral BKA 2009. Patient leans: N/A  Psychiatric Specialty Exam: Physical Exam as per history and physical   ROS Complaining about depression, anxiety, restlessness, insomnia, hopelessness and worthlessness.   Blood pressure (!) 148/72, pulse 62, temperature 98.5 F (36.9 C), temperature source Oral, resp. rate 18, height 5\' 7"  (1.702 m), weight (!) 160.6 kg (354 lb), last menstrual period 04/11/2008, SpO2 95 %.Body mass index is 55.44 kg/m.  General Appearance: Casual  Eye Contact:  Good  Speech:  Clear and Coherent  Volume:  Normal  Mood:  Euthymic  Affect:  Appropriate, Congruent and reactive to her mood. she is able to smile and laugh during this visit.   Thought Process:  Coherent and Goal Directed, worried about place to live and how people treat her.  Orientation:  Full (Time, Place, and Person)  Thought Content:  WDL  Suicidal Thoughts:  No, denied suicide ideation, intention and plans  Homicidal Thoughts:  No  Memory:  Immediate;   Fair Recent;   Fair  Judgement:  Fair  Insight:  Fair  Psychomotor Activity:  Decreased  Concentration:  Concentration: Fair and Attention Span: Fair  Recall:  Good  Fund of Knowledge:  Fair  Language:  Good  Akathisia:  Negative  Handed:  Right  AIMS (if indicated):     Assets:  Communication Skills Desire for Improvement Financial Resources/Insurance Leisure Time Resilience Social Support  ADL's:  Impaired  Cognition:  WNL  Sleep:        Treatment Plan  Summary: Patient admitted with multiple medical problems, psychosocial stresses and depression, anxiety, and denied active suicidal ideation without intention and plan. Based on my clinical impression patient current clinical presentation is secondary to psychosocial situation does not meet criteria for acute psychiatric hospitalization. Patient indicated that she is willing to go to the skilled nursing facility for brief period within few days and feels he is not ready to interact with the new people as of today.  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   Homicidal ideation:  Patient denies homicidal ideation, intention or plans.   Depression:  Fluoxetine 20 mg daily for depression  Mood swings and irritability:  Seroquel 25 mg 3 times daily Discussed case with [name] to contact patient family members regarding collateral information and possibly psychosocial support needs.    Disposition: Patient benefit from short-term/brief skilled nursing facility until her family is able to find a place to live  Supportive therapy provided about ongoing stressors.  Ambrose Finland, MD 04/17/2016 10:57 AM

## 2016-04-17 NOTE — Clinical Social Work Psych Note (Addendum)
Clinical Social Worker Psych Service Line Progress Note  Clinical Social Worker: Lia Hopping, [name] Date/Time: 04/17/2016, 3:01 PM   Review of Patient  Overall Medical Condition: Medically Stable, Cleared by Psych  Participation Level:  Active Participation Quality: Appropriate Other Participation Quality:  Patient presented with laughter and receptive to Psych. MD and LCSWA support.   Affect: Appropriate Cognitive: Alert, Oriented Reaction to Medications/Concerns:  No concerns reported.   Modes of Intervention: Solution-focused, Support   Summary of Progress/Plan at Discharge  Summary of Progress/Plan at Discharge: [name], Psychiatrist met with patient at bedside, pt. Agreeable to assessment. Patient reports she is ready and willing to go SNF at this time. Patient reports she was able to contact family member but did not remember the number to contact her, as it was disconnected the next day.  Patient reports feeling better and feels she has been treated well by staff. No concerns presented by patient.  Plan: Assist with disposition to SNF.

## 2016-04-17 NOTE — Care Management Note (Signed)
Case Management Note  Patient Details  Name: [name] MRN: [id number] Date of Birth: 03-01-1966  Subjective/Objective: Per attending-medically stable for d/c SNF. Psych-cleared for d/c SNF.Psych CSW following for SNF.                   Action/Plan:d/c SNF.   Expected Discharge Date:   (unknown)               Expected Discharge Plan:  Skilled Nursing Facility  In-House Referral:  Clinical Social Work  Discharge planning Services  CM Consult  Post Acute Care Choice:    Choice offered to:     DME Arranged:    DME Agency:     HH Arranged:    [place] Agency:     Status of Service:  Completed, signed off  If discussed at [employer], dates discussed:    Additional Comments:  Dessa Phi, RN 04/17/2016, 11:12 AM

## 2016-04-17 NOTE — Progress Notes (Signed)
Patient cleared by Psychiatrist MD.  Patient is being reviewed by SNF facilities. PASRR level II received.

## 2016-04-17 NOTE — NC FL2 (Signed)
[place] LEVEL OF CARE SCREENING TOOL     IDENTIFICATION  Patient Name: [name] Birthdate: 1966-04-15 Sex: female Admission Date (Current Location): 04/11/2016  [facility] and [place] Number:  Herbalist and Address:  [facility],  [address], [place]      Provider Number: [id number]  Attending Physician Name and Address:  Murlean Iba, MD  Relative Name and Phone Number:  Tyreona Kochersperger: [phone]    Current Level of Care: Hospital Recommended Level of Care: [place] Prior Approval Number:    Date Approved/Denied:   PASRR Number:    Discharge Plan: SNF    Current Diagnoses: Patient Active Problem List   Diagnosis Date Noted  . Chest pain 04/11/2016  . Depression 04/11/2016  . CKD (chronic kidney disease) stage 4, GFR 15-29 ml/min (HCC) 04/11/2016  . HTN (hypertension) 04/11/2016  . DM coma, type 2 ([place]) 04/11/2016  . S/P bilateral BKA (below knee amputation) ([place]) 04/11/2016  . Peripheral neuropathy ([place]) 04/11/2016  . Morbid obesity ([place]) 04/11/2016    Orientation RESPIRATION BLADDER Height & Weight     Self, Time, Situation, Place  O2 (2L) Incontinent Weight: (!) 354 lb (160.6 kg) Height:  5\' 7"  (170.2 cm) (Below the kneww amputation)  BEHAVIORAL SYMPTOMS/MOOD NEUROLOGICAL BOWEL NUTRITION STATUS      Incontinent Diet  AMBULATORY STATUS COMMUNICATION OF NEEDS Skin   Total Care Verbally Normal                       Personal Care Assistance Level of Assistance  Bathing, Feeding, Dressing Bathing Assistance: Maximum assistance Feeding assistance: Independent Dressing Assistance: Maximum assistance     Functional Limitations Info  Sight, Hearing, Speech Sight Info: Impaired Hearing Info: Adequate Speech Info: Adequate    SPECIAL CARE FACTORS FREQUENCY  PT (By licensed PT)                    Contractures Contractures Info: Not  present    Additional Factors Info  Code Status, Insulin Sliding Scale, Allergies, Psychotropic Code Status Info: Full Code Allergies Info: No Known Allergies Psychotropic Info: Yes Insulin Sliding Scale Info: insulin aspart (novoLOG) injection 0-15 Units       Current Medications (04/17/2016):  This is the current hospital active medication list Current Facility-Administered Medications  Medication Dose Route Frequency Provider Last Rate Last Dose  . 0.9 %  sodium chloride infusion  250 mL Intravenous PRN Jonetta Osgood, MD      . acetaminophen (TYLENOL) tablet 650 mg  650 mg Oral Q6H PRN Shanker Kristeen Mans, MD       Or  . acetaminophen (TYLENOL) suppository 650 mg  650 mg Rectal Q6H PRN Shanker Kristeen Mans, MD      . albuterol (PROVENTIL) (2.5 MG/3ML) 0.083% nebulizer solution 2.5 mg  2.5 mg Nebulization Q2H PRN Jonetta Osgood, MD      . aspirin EC tablet 81 mg  81 mg Oral Daily Jonetta Osgood, MD   81 mg at 04/17/16 0948  . FLUoxetine (PROZAC) capsule 20 mg  20 mg Oral Daily Clan[place] Marisa Hua, MD   20 mg at 04/17/16 0948  . fluticasone (FLONASE) 50 MCG/ACT nasal spray 1 spray  1 spray Each Nare BID Jonetta Osgood, MD   1 spray at 04/17/16 0949  . heparin injection 5,000 Units  5,000 Units Subcutaneous Q8H Shanker Kristeen Mans, MD  5,000 Units at 04/17/16 [id number]  . hydrALAZINE (APRESOLINE) injection 10 mg  10 mg Intravenous Q6H PRN Jonetta Osgood, MD      . hydrALAZINE (APRESOLINE) tablet 25 mg  25 mg Oral Q8H Shanker Kristeen Mans, MD   25 mg at 04/17/16 [id number]  . insulin aspart (novoLOG) injection 0-15 Units  0-15 Units Subcutaneous TID WC Jonetta Osgood, MD   2 Units at 04/17/16 [phone]  . insulin detemir (LEVEMIR) injection 10 Units  10 Units Subcutaneous BID Jonetta Osgood, MD   10 Units at 04/17/16 [phone]  . loratadine (CLARITIN) tablet 10 mg  10 mg Oral Daily Jonetta Osgood, MD   10 mg at 04/17/16 0948  . metoprolol tartrate (LOPRESSOR) tablet 25 mg  25 mg Oral BID Jonetta Osgood, MD   25 mg at 04/17/16 0948  . ondansetron (ZOFRAN) injection 4 mg  4 mg Intravenous Q6H PRN Shanker Kristeen Mans, MD      . ondansetron (ZOFRAN) tablet 4 mg  4 mg Oral Q6H PRN Jonetta Osgood, MD      . pantoprazole (PROTONIX) EC tablet 40 mg  40 mg Oral Daily Jonetta Osgood, MD   40 mg at 04/17/16 0948  . pregabalin (LYRICA) capsule 25 mg  25 mg Oral QHS Jonetta Osgood, MD   25 mg at 04/16/16 2237  . QUEtiapine (SEROQUEL) tablet 25 mg  25 mg Oral TID Jonetta Osgood, MD   25 mg at 04/17/16 0948  . sodium chloride flush (NS) 0.9 % injection 3 mL  3 mL Intravenous Q12H Jonetta Osgood, MD   3 mL at 04/17/16 1000  . sodium chloride flush (NS) 0.9 % injection 3 mL  3 mL Intravenous PRN Shanker Kristeen Mans, MD         Discharge Medications: Please see discharge summary for a list of discharge medications.  Relevant Imaging Results:  Relevant Lab Results:   Additional Information ss#[id number]  Lia Hopping, [name]

## 2016-04-17 NOTE — Progress Notes (Signed)
Physical Therapy Treatment Patient Details Name: [name] MRN: [id number] DOB: Jan 10, 1966 Today's Date: 04/17/2016    History of Present Illness Pt is admitted here for face to face meeting with psychiatrist medical history significant of of type 2 diabetes, hypertension, bilateral BKA, and occasional noncompliance prior to this admission, depression/bipolar disorder with prior suicidal attempt     PT Comments    Pt very pleasant and cooperative.  Assisted with bed mobility esp side to side rolling.  Pt also  performed sidline to sit to EOB then back to supine.  Pt required increased time and used rails.    Follow Up Recommendations  SNF     Equipment Recommendations       Recommendations for Other Services       Precautions / Restrictions Precautions Precautions: Fall Restrictions Other Position/Activity Restrictions: non amb uses a power scooter for mobility    Mobility  Bed Mobility Overal bed mobility: Needs Assistance Bed Mobility: Rolling;Sidelying to Sit;Supine to Sit;Sit to Supine;Sit to Sidelying Rolling: Mod assist;+2 for physical assistance Sidelying to sit: Mod assist;+2 for physical assistance Supine to sit: Mod assist;+2 for physical assistance Sit to supine: Mod assist;+2 for physical assistance   General bed mobility comments: rolling towards R requires increased assist as pt is able to pick up L LE and place over R but unable to perform with R LE.  Utilizes bed rails to complete tasks.  Required increased time.   was able to partially scoot to EOB then sat EOB x 12 min Independ.  Required increased assist to get back to bed and total assist to scoot to [facility].    Transfers                    Ambulation/Gait                 Stairs            Wheelchair Mobility    Modified Rankin (Stroke Patients Only)       Balance                                    Cognition Arousal/Alertness: Awake/alert Behavior  During Therapy: WFL for tasks assessed/performed Overall Cognitive Status: Within Functional Limits for tasks assessed                      Exercises      General Comments        Pertinent Vitals/Pain      Home Living                      Prior Function            PT Goals (current goals can now be found in the care plan section) Progress towards PT goals: Progressing toward goals    Frequency       PT Plan Current plan remains appropriate    Co-evaluation             End of Session   Activity Tolerance: Patient tolerated treatment well Patient left: in bed     Time: [id number] PT Time Calculation (min) (ACUTE ONLY): 25 min  Charges:  $Therapeutic Activity: 23-37 mins                    G Codes:      Rica Koyanagi  PTA [facility]  Acute  Rehab Pager      [phone]

## 2016-04-17 NOTE — Discharge Summary (Addendum)
Physician Discharge Summary  [name] [id number] DOB: Aug 02, 1966 DOA: 04/11/2016  PCP: No primary care provider on file.  Admit date: 04/11/2016 Admitted From: [name] Disposition: SNF when available  PATIENT DID NOT GET DISCHARGED ON 04/17/16 AND REMAINED IN HOSPITAL.  ________________________________________________________________ Recommendations for Outpatient Follow-up:  1. Follow up with PCP and psychiatrist in 1 month  Discharge Condition: medically stable CODE STATUS: FULL Diet recommendation: Heart Healthy / Carb Modified / Renal   Brief/Interim Summary: Hospital course: Kathy Oldham Currieis a 50 y.o.femalewith medical history significant of of type 2 diabetes, hypertension, bilateral BKA, and occasional noncompliance prior to this admission, depression/bipolar disorder with prior suicidal attempt who initially was admitted to [name] on 04/01/16 for evaluation of chest pain during that visit she also claimed that she had suicidal ideation by overdosing on multiple medications. She was subsequently admitted to [name], cardiac enzymes remained and negative. Cipro course was complicated by development of uncontrolled hypertension for which she was treated with multiple antihypertensives and a UTI for which she required intravenous antibiotics. She was followed by telemetry psychiatry at [name], initially recommendations were to transfer to an inpatient psychiatric facility, psychiatry recommended that the patient be transferred to [name] for face-to-face evaluation by a psychiatrist.   Assessment & Plan:  .Chest pain:EKG and enzymes were negative at [name] was admitted [name] on 7/23 for this primary reason. I do not think she requires any further workup at this point, she does have advanced chronic kidney disease which limits his ability to do invasive procedures. Continue treating with aspirin.    .Suicidal ideation: Continues to complain of suicidal ideation-she claims that she was hospitalized a few years back at [name] for similar issues. She does have disposition problems on discharge as well that may be contributing to her stress. We will get psychiatry evaluation. Continue sitter at bedside.  Further management pending psychiatric evaluation. They have increased her fluoxetine to 20 mg.  They have recommended SNF placement. They recommend continued Air cabin crew.   Reconsulted psychiatry 8/7 to assist with disposition.  Sitter discontinued 8/7.  Pt was contracted for safety by psychiatrist.    .Depression:Continue Seroquel for mood fluctuationsand fluoxetine.  .CKD (chronic kidney disease) stage 4, GFR 15-29 ml/min:Apparently patient had a creatinine of around 3.5 during her stay at [name], not sure what her usual baseline is. But given her noncompliance to medications, it is highly likely that she probably has either hypertensive nephrosclerosis or diabetic nephropathy. Continue to monitor renal function. Pt says she has never seen a nephrologist.  Would schedule outpatient appointment with [name] at discharge.  Pt says that she would never consider dialysis even if it means death but given her current severe depression she could change her feelings about this after treatment.  Follow up appointment with [name] after discharge.    Marland KitchenHTN (hypertension):Controlled at this time.   Continue current meds.    .DM, type 2 :Probably was noncompliant to any of her oral hypoglycemic medications prior to her most recent hospitalization at [facility]. For now place on SSI follow CBGs.   CBG (last 3)   Recent Labs (last 2 labs)    Recent Labs  04/15/16 1633 04/15/16 2133 04/16/16 0743  GLUCAP 133* 164* 166*     .Morbid obesity ([name]): Counseledregarding importance of weight loss  .Homelessness:Claims that she is not able  to go back to her daughter's place where she was living for the past 1 year.  [name] consulted to help with placement.  PT recommending SNF.     CONSULTS: Psychiatry (Called on admission) [name] Care Manager  DVT Prophylaxis: Prophylactic Heparin  Code Status: Full Code  Disposition Plan:SNF.  Pt is medically stable for transfer at this time.   Discharge Diagnoses:  Active Problems:   Chest pain   Depression   CKD (chronic kidney disease) stage 4, GFR 15-29 ml/min (HCC)   HTN (hypertension)   DM coma, type 2 (HCC)   S/P bilateral BKA (below knee amputation) ([name])   Peripheral neuropathy (HCC)   Morbid obesity ([name])  Discharge Instructions  Discharge Instructions    Diet - low sodium heart healthy    Complete by:  As directed   Increase activity slowly    Complete by:  As directed       Medication List    STOP taking these medications   heparin 5000 UNIT/ML injection   insulin regular 100 units/mL injection Commonly known as:  NOVOLIN R,HUMULIN R     TAKE these medications   acetaminophen 650 MG CR tablet Commonly known as:  TYLENOL Take 650 mg by mouth every 6 (six) hours as needed for pain or fever.   aspirin 81 MG chewable tablet Chew 81 mg by mouth daily.   FLUoxetine 20 MG tablet Commonly known as:  PROZAC Take 1 tablet (20 mg total) by mouth daily. What changed:  medication strength  how much to take   fluticasone 50 MCG/ACT nasal spray Commonly known as:  FLONASE Place 1 spray into both nostrils 2 (two) times daily.   hydrALAZINE 25 MG tablet Commonly known as:  APRESOLINE Take 1 tablet (25 mg total) by mouth every 8 (eight) hours. What changed:  medication strength  how much to take   insulin detemir 100 UNIT/ML injection Commonly known as:  LEVEMIR Inject 10 Units into the skin 2 (two) times daily.   ipratropium-albuterol 0.5-2.5 (3) MG/3ML Soln Commonly known as:  DUONEB Take 3 mLs by nebulization every 4 (four) hours as  needed (for wheezing/shortness of breath). What changed:  when to take this   loratadine 10 MG tablet Commonly known as:  CLARITIN Take 10 mg by mouth daily.   metoprolol tartrate 25 MG tablet Commonly known as:  LOPRESSOR Take 25 mg by mouth 2 (two) times daily.   pregabalin 25 MG capsule Commonly known as:  LYRICA Take 25 mg by mouth at bedtime.   QUEtiapine 25 MG tablet Commonly known as:  SEROQUEL Take 25 mg by mouth 3 (three) times daily.      Follow-up Information    [facility]. Schedule an appointment as soon as possible for a visit in 1 month(s).   Why:  Establish Care Regarding Kidney Care Contact information: [place] [address] [phone]        [facility]. Schedule an appointment as soon as possible for a visit in 1 month(s).   Why:  Establish Primary Care  Contact information: [address] [place] [phone]         No Known Allergies  Procedures/Studies:  No results found.  Subjective: No significant changes.    Discharge Exam: Vitals:   04/17/16 0430 04/17/16 0626  BP: 122/63 (!) 148/72  Pulse: (!) 58 62  Resp: 20 18  Temp: 98.5 F (36.9 C) 98.5 F (36.9 C)   Vitals:   04/16/16 2100 04/16/16 2237 04/17/16 0430 04/17/16 0626  BP: 126/62  122/63 (!) 148/72  Pulse: (!) 57 60 (!) 58 62  Resp: 18  20 18   Temp: 98.3 F (36.8 C)  98.5 F (36.9 C) 98.5 F (36.9 C)  TempSrc: Oral  Oral Oral  SpO2: 94%  95%   Weight:      Height:       General appearance :Awake, alert, not in any distress. Cooperative.  Speech Clear. Nontoxic appearing. She is morbidly obese HEENT: Atraumatic and Normocephalic, pupils equally reactive to light and accomodation. Neck: supple, no JVD. No cervical lymphadenopathy.  Chest:Good air entry bilaterally, no added sounds  CVS: S1 S2 regular, no murmurs.  Abdomen: Bowel sounds present, Non tender and not distended with no  gaurding, rigidity or rebound. Extremities: B/LBKA stump without any ulceration  Neurology: Non focal Psychiatric: Normal judgment and insight. Alert and oriented x 3. Normal mood. Skin:No Rash  The results of significant diagnostics from this hospitalization (including imaging, microbiology, ancillary and laboratory) are listed below for reference.    Microbiology: Recent Results (from the past 240 hour(s))  MRSA PCR Screening     Status: None   Collection Time: 04/11/16  3:18 PM  Result Value Ref Range Status   MRSA by PCR NEGATIVE NEGATIVE Final    Comment:        The GeneXpert MRSA Assay (FDA approved for NASAL specimens only), is one component of a comprehensive MRSA colonization surveillance program. It is not intended to diagnose MRSA infection nor to guide or monitor treatment for MRSA infections.      Labs: BNP (last 3 results) No results for input(s): BNP in the last 8760 hours. Basic Metabolic Panel:  Recent Labs Lab 04/11/16 1611 04/12/16 0543  NA 133* 135  K 5.2* 4.9  CL 106 108  CO2 20* 20*  GLUCOSE 184* 162*  BUN 53* 55*  CREATININE 3.68* 3.66*  CALCIUM 8.3* 8.1*   Liver Function Tests: No results for input(s): AST, ALT, ALKPHOS, BILITOT, PROT, ALBUMIN in the last 168 hours. No results for input(s): LIPASE, AMYLASE in the last 168 hours. No results for input(s): AMMONIA in the last 168 hours. CBC:  Recent Labs Lab 04/11/16 1611 04/12/16 0543  WBC 6.6 6.8  HGB 8.6* 7.9*  HCT 27.2* 25.3*  MCV 82.9 83.0  PLT 317 300   Cardiac Enzymes: No results for input(s): CKTOTAL, CKMB, CKMBINDEX, TROPONINI in the last 168 hours. BNP: Invalid input(s): POCBNP CBG:  Recent Labs Lab 04/16/16 0743 04/16/16 1150 04/16/16 1631 04/16/16 2128 04/17/16 0824  GLUCAP 166* 165* 170* 130* 134*   D-Dimer No results for input(s): DDIMER in the last 72 hours. Hgb A1c No results for input(s): HGBA1C in the last 72 hours. Lipid Profile No results for  input(s): CHOL, HDL, LDLCALC, TRIG, CHOLHDL, LDLDIRECT in the last 72 hours. Thyroid function studies No results for input(s): TSH, T4TOTAL, T3FREE, THYROIDAB in the last 72 hours.  Invalid input(s): FREET3 Anemia work up No results for input(s): VITAMINB12, FOLATE, FERRITIN, TIBC, IRON, RETICCTPCT in the last 72 hours. Urinalysis No results found for: COLORURINE, APPEARANCEUR, [place], [place], [place], [place], [place], [place], PROTEINUR, UROBILINOGEN, NITRITE, LEUKOCYTESUR Sepsis Labs Invalid input(s): PROCALCITONIN,  WBC,  LACTICIDVEN Microbiology Recent Results (from the past 240 hour(s))  MRSA PCR Screening     Status: None   Collection Time: 04/11/16  3:18 PM  Result Value Ref Range Status   MRSA by PCR NEGATIVE NEGATIVE Final    Comment:        The GeneXpert MRSA Assay (FDA  approved for NASAL specimens only), is one component of a comprehensive MRSA colonization surveillance program. It is not intended to diagnose MRSA infection nor to guide or monitor treatment for MRSA infections.    Time coordinating discharge: 32 mins  SIGNED:  Irwin Brakeman, MD  [facility] 04/17/2016, 11:12 AM Pager   If 7PM-7AM, please contact night-coverage [email or web address] Password [email or web address]

## 2016-04-18 DIAGNOSIS — G6289 Other specified polyneuropathies: Secondary | ICD-10-CM

## 2016-04-18 LAB — GLUCOSE, CAPILLARY
GLUCOSE-CAPILLARY: 93 mg/dL (ref 65–99)
Glucose-Capillary: 134 mg/dL — ABNORMAL HIGH (ref 65–99)
Glucose-Capillary: 122 mg/dL — ABNORMAL HIGH (ref 65–99)
Glucose-Capillary: 152 mg/dL — ABNORMAL HIGH (ref 65–99)
Glucose-Capillary: 134 mg/dL — ABNORMAL HIGH (ref 65–99)

## 2016-04-18 MED ORDER — ONDANSETRON 4 MG PO TBDP: 4.0000 mg | ORAL_TABLET | Freq: Three times a day (TID) | ORAL | Status: DC | PRN

## 2016-04-18 NOTE — Progress Notes (Signed)
Update: [facility], [facility] called and informed LCSWA they cannot take patient tonight. Patient has to be without sitter for 72 hours.  Patient can be transferred out tomorrow. Patient informed. RN informed.  Kathrin Greathouse, Latanya Presser, MSW Clinical Social Worker 5E and Psychiatric Service Line [phone] 04/18/2016  3:42 PM

## 2016-04-18 NOTE — Progress Notes (Signed)
Progress Note    [name]  [id number] DOB: 02/14/1966  DOA: 04/11/2016 PCP: No primary care provider on file.    Brief Narrative:   [name] is an 50 y.o. female  with medical history significant of of type 2 diabetes, hypertension, bilateral BKA, and occasional noncompliance prior to this admission, depression/bipolar disorder with prior suicidal attempt who initially was admitted to [facility] on 04/01/16 for evaluation of chest pain during that visit she also claimed that she had suicidal ideation by overdosing on multiple medications. She was subsequently admitted to [facility], cardiac enzymes remained and negative. Her hospital course was complicated by development of uncontrolled hypertension for which she was treated with multiple antihypertensives and a UTI for which she required intravenous antibiotics. She was followed by tele psychiatry at [facility], initially recommendations were to transfer to an inpatient psychiatric facility, however psychiatry recommended that the patient be transferred to [facility] for face-to-face evaluation by a psychiatrist on 04/11/16.   Assessment/Plan:   Principal Problem:   Chest pain EKG and enzymes were negative at [facility] was admitted [facility] on 7/23 for this primary reason. I do not think she requires any further workup at this point, she does have advanced chronic kidney disease which limits his ability to do invasive procedures. Continue treating with aspirin.   Active Problems:   Depression with suicidal ideation Continues to complain of suicidal ideation. She does have disposition problems on discharge as well that may be contributing to her stress. We evaluated by psychiatrist on 04/17/16. Does not meet criteria for acute psychiatric hospitalization. Safety sitter discontinued as the patient contracted for safety and did not have an active suicidal plan. Fluoxetine 20 mg and  Seroquel 25 mg 3 times a day recommended.    CKD (chronic kidney disease) stage 4, GFR 15-29 ml/min (HCC) Apparently patient does have a creatinine of around 3.5 during her stay at [facility], not sure what her usual baseline is. But given her noncompliance to medications, it is highly likely that she probably has either hypertensive nephrosclerosis or diabetic nephropathy. Continue to monitor renal function. Pt says she has never seen a nephrologist.  Would schedule outpatient appointment with [facility] at discharge.  Pt says that she would never consider dialysis even if it means death but given her current severe depression she could change her feelings about this after treatment.     HTN (hypertension) Controlled at this time.   Continue current meds.       DM type 2 ([place]) with multiple complications Currently being managed with 10 units of Levemir daily and moderate scale SSI 3 times a day. CBGs well-controlled 130-170.    S/P bilateral BKA (below knee amputation) ([place]) Peripheral neuropathy (HCC)/Morbid obesity ([place]) For SNF placement.   Family Communication/Anticipated D/C date and plan/Code Status   DVT prophylaxis: Subcutaneous heparin ordered. Code Status: Full Code.  Family Communication: No family at the bedsie. Disposition Plan: SNF when bed available.   Medical Consultants:    Psychiatry   Procedures:    Anti-Infectives:   Anti-infectives    None      Subjective:   [name] feels depressed, but does not have any current suicidal thoughts.  She has a flat affect.    Objective:    Vitals:   04/17/16 0626 04/17/16 1309 04/17/16 2229 04/18/16 0617  BP: (!) 148/72 122/62 (!) 122/58 (!) 139/59  Pulse: 62 60 (!) 56 (!) 57  Resp: 18 20 16 18   Temp: 98.5 F (36.9 C) 98.1 F (36.7 C) 98.7 F (37.1 C) 98.7 F (37.1 C)  TempSrc: Oral Oral Oral Oral  SpO2:  99% 95% 97%  Weight:      Height:       No intake or output data in the 24  hours ending 04/18/16 0822 Filed Weights   04/11/16 1520  Weight: (!) 160.6 kg (354 lb)    Exam: General exam: Appears calm and comfortable. Morbidly obese. Respiratory system: Clear to auscultation, but diminished throughout. Respiratory effort normal. Cardiovascular system: S1 & S2 heard, RRR. No JVD,  rubs, gallops or clicks. No murmurs. Gastrointestinal system: Abdomen is nondistended, soft and nontender. No organomegaly or masses felt. Normal bowel sounds heard. Central nervous system: Alert and oriented. No focal neurological deficits. Extremities: Bilateral BKAs. Skin: No rashes, lesions or ulcers. Psychiatry: Judgement and insight appear diminished. Mood & affect depressed/flat.   Data Reviewed:   I have personally reviewed following labs and imaging studies:  Labs: Basic Metabolic Panel:  Recent Labs Lab 04/11/16 1611 04/12/16 0543  NA 133* 135  K 5.2* 4.9  CL 106 108  CO2 20* 20*  GLUCOSE 184* 162*  BUN 53* 55*  CREATININE 3.68* 3.66*  CALCIUM 8.3* 8.1*   GFR Estimated Creatinine Clearance: 29.7 mL/min (by C-G formula based on SCr of 3.66 mg/dL). Liver Function Tests: No results for input(s): AST, ALT, ALKPHOS, BILITOT, PROT, ALBUMIN in the last 168 hours. No results for input(s): LIPASE, AMYLASE in the last 168 hours. No results for input(s): AMMONIA in the last 168 hours. Coagulation profile No results for input(s): INR, PROTIME in the last 168 hours.  CBC:  Recent Labs Lab 04/11/16 1611 04/12/16 0543  WBC 6.6 6.8  HGB 8.6* 7.9*  HCT 27.2* 25.3*  MCV 82.9 83.0  PLT 317 300   CBG:  Recent Labs Lab 04/16/16 1631 04/16/16 2128 04/17/16 0824 04/17/16 1141 04/17/16 1641  GLUCAP 170* 130* 134* 165* 157*   Microbiology Recent Results (from the past 240 hour(s))  MRSA PCR Screening     Status: None   Collection Time: 04/11/16  3:18 PM  Result Value Ref Range Status   MRSA by PCR NEGATIVE NEGATIVE Final    Comment:        The GeneXpert  MRSA Assay (FDA approved for NASAL specimens only), is one component of a comprehensive MRSA colonization surveillance program. It is not intended to diagnose MRSA infection nor to guide or monitor treatment for MRSA infections.     Radiology: No results found.  Medications:   . aspirin EC  81 mg Oral Daily  . FLUoxetine  20 mg Oral Daily  . fluticasone  1 spray Each Nare BID  . heparin  5,000 Units Subcutaneous Q8H  . hydrALAZINE  25 mg Oral Q8H  . insulin aspart  0-15 Units Subcutaneous TID WC  . insulin detemir  10 Units Subcutaneous BID  . loratadine  10 mg Oral Daily  . metoprolol tartrate  25 mg Oral BID  . pantoprazole  40 mg Oral Daily  . pregabalin  25 mg Oral QHS  . QUEtiapine  25 mg Oral TID   Continuous Infusions:   Time spent: 25 minutes.   LOS: 7 days   [facility] Pager [phone]. If unable to reach me by pager, please call my cell phone at [phone].  *Please refer to [email or web address], password [email or web address] to get updated schedule on who will round on  this patient, as hospitalists switch teams weekly. If 7PM-7AM, please contact night-coverage at [email or web address], password [email or web address] for any overnight needs.  04/18/2016, 8:22 AM

## 2016-04-18 NOTE — Progress Notes (Signed)
Spoke with [facility] representative Gerald Stabs regarding patient care.LCSWA met with patient at bedside. Informed patient about acceptance to [facility]. Patient is receptive to placement at this time. Patient reports concerns about her belongings, RN to follow up.  LCSWA attempted to call family, pt. Reports to try after 6:30pm. ED CSW will follow up.  Clinicals sent/DC summary sent

## 2016-04-18 NOTE — Clinical Social Work Placement (Signed)
   CLINICAL SOCIAL WORK PLACEMENT  NOTE  Date:  04/18/2016  Patient Details  Name: [name] MRN: [id number] Date of Birth: 1966/03/16  Clinical Social Work is seeking post-discharge placement for this patient at the [place] level of care (*CSW will initial, date and re-position this form in  chart as items are completed):  Yes   Patient/family provided with [facility] Work Department's list of facilities offering this level of care within the geographic area requested by the patient (or if unable, by the patient's family).  Yes   Patient/family informed of their freedom to choose among providers that offer the needed level of care, that participate in Medicare, Medicaid or managed care program needed by the patient, have an available bed and are willing to accept the patient.  Yes   Patient/family informed of [facility]'s ownership interest in [facility] and [facility], as well as of the fact that they are under no obligation to receive care at these facilities.  PASRR submitted to EDS on 04/17/16     PASRR number received on 04/17/16     Existing PASRR number confirmed on       FL2 transmitted to all facilities in geographic area requested by pt/family on       FL2 transmitted to all facilities within larger geographic area on 04/12/16     Patient informed that his/her managed care company has contracts with or will negotiate with certain facilities, including the following:  [facility]     Yes   Patient/family informed of bed offers received.  Patient chooses bed at [facility]     Physician recommends and patient chooses bed at      Patient to be transferred to [facility] on 04/18/16.  Patient to be transferred to facility by [facility]     Patient family notified on 04/18/16 of transfer.  Name of family member notified:  CSW has not been able to contact family. Evening CSW will attempt  later time provided by patient. Daughter: [name]: [phone]     PHYSICIAN       Additional Comment:    _______________________________________________ Lia Hopping, [name] 04/18/2016, 2:20 PM

## 2016-04-19 DIAGNOSIS — E1169 Type 2 diabetes mellitus with other specified complication: Secondary | ICD-10-CM

## 2016-04-19 LAB — GLUCOSE, CAPILLARY
GLUCOSE-CAPILLARY: 174 mg/dL — AB (ref 65–99)
Glucose-Capillary: 110 mg/dL — ABNORMAL HIGH (ref 65–99)
Glucose-Capillary: 146 mg/dL — ABNORMAL HIGH (ref 65–99)
Glucose-Capillary: 135 mg/dL — ABNORMAL HIGH (ref 65–99)

## 2016-04-19 NOTE — Progress Notes (Signed)
Physical Therapy Treatment Patient Details Name: NATALYA KRENKE MRN: [id number] DOB: 1965/11/15 Today's Date: 04/19/2016    History of Present Illness Pt is admitted here for face to face meeting with psychiatrist medical history significant of of type 2 diabetes, hypertension, bilateral BKA, and occasional noncompliance prior to this admission, depression/bipolar disorder with prior suicidal attempt     PT Comments    Assisted with side to side rolling with NT to perform hygiene care then assisted pt to EOB with increased time and use of rails.  Once upright, pt is able to sit EOB Indep.  Pt requested to stay up to finish her lunch.   Follow Up Recommendations  SNF     Equipment Recommendations       Recommendations for Other Services       Precautions / Restrictions Precautions Precautions: Fall Precaution Comments: Hx B BKA Restrictions Other Position/Activity Restrictions: non amb uses a power scooter for mobility    Mobility  Bed Mobility Overal bed mobility: Needs Assistance Bed Mobility: Rolling;Sidelying to Sit;Supine to Sit;Sit to Supine;Sit to Sidelying Rolling: Mod assist;+2 for physical assistance Sidelying to sit: Mod assist;+2 for physical assistance Supine to sit: Mod assist;+2 for physical assistance Sit to supine: Mod assist;+2 for physical assistance   General bed mobility comments: pt able to perform all bed mobility at [place] with use of rails and increased time.  Assisted with supine to sidelying to EOB.  Pt sat EOB Indep once upright.    Transfers    unable to attempt due to bed height with recliner height.                Ambulation/Gait                 Stairs            Wheelchair Mobility    Modified Rankin (Stroke Patients Only)       Balance                                    Cognition Arousal/Alertness: Awake/alert Behavior During Therapy: WFL for tasks assessed/performed Overall Cognitive  Status: Within Functional Limits for tasks assessed                      Exercises      General Comments        Pertinent Vitals/Pain Pain Assessment: No/denies pain    Home Living                      Prior Function            PT Goals (current goals can now be found in the care plan section) Progress towards PT goals: Progressing toward goals    Frequency       PT Plan Current plan remains appropriate    Co-evaluation             End of Session   Activity Tolerance: Patient tolerated treatment well Patient left: in bed (EOB)     Time: [id number] PT Time Calculation (min) (ACUTE ONLY): 17 min  Charges:  $Therapeutic Activity: 8-22 mins                    G Codes:      Rica Koyanagi  PTA [facility]  Acute  Rehab Pager      [phone]

## 2016-04-19 NOTE — Progress Notes (Signed)
Progress Note    [name]  [id number] DOB: [birth year]  DOA: 04/11/2016 PCP: No primary care provider on file.    Brief Narrative:   [name] is an 50 y.o. female  with medical history significant of of type 2 diabetes, hypertension, bilateral BKA, and occasional noncompliance prior to this admission, depression/bipolar disorder with prior suicidal attempt who initially was admitted to [facility] on 04/01/16 for evaluation of chest pain during that visit she also claimed that she had suicidal ideation by overdosing on multiple medications. She was subsequently admitted to [facility], cardiac enzymes remained and negative. Her hospital course was complicated by development of uncontrolled hypertension for which she was treated with multiple antihypertensives and a UTI for which she required intravenous antibiotics. She was followed by tele psychiatry at [facility], initially recommendations were to transfer to an inpatient psychiatric facility, however psychiatry recommended that the patient be transferred to [facility] for face-to-face evaluation by a psychiatrist on 04/11/16.   Assessment/Plan:   Principal Problem:   Chest pain EKG and enzymes were negative at [facility] was admitted [facility] on 7/23 for this primary reason. No further workup planned. She does have advanced chronic kidney disease which limits the ability to do invasive procedures. She has not had any further chest pains. Continue aspirin.   Active Problems:   Depression with suicidal ideation No further complaints of suicidal ideation. She does have disposition problems on discharge as well that may be contributing to her stress. We evaluated by psychiatrist on 04/17/16. Did not meet criteria for acute psychiatric hospitalization. Safety sitter discontinued as the patient contracted for safety and did not have an active suicidal plan. Continue Fluoxetine 20 mg and  Seroquel 25 mg 3 times a day. Continues to deny suicidal thoughts/intent.    CKD (chronic kidney disease) stage 4, GFR 15-29 ml/min (HCC) Apparently patient does have a creatinine of around 3.5 during her stay at [facility], not sure what her usual baseline is. But given her noncompliance to medications, it is highly likely that she probably has either hypertensive nephrosclerosis or diabetic nephropathy. Continue to monitor renal function. Pt says she has never seen a nephrologist.  Would schedule outpatient appointment with [facility] at discharge.  Pt says that she would never consider dialysis even if it means death but given her current severe depression she could change her feelings about this after treatment.     HTN (hypertension) Controlled at this time.  Continue metoprolol.       DM type 2 ([place]) with multiple complications Currently being managed with 10 units of Levemir daily and moderate scale SSI 3 times a day. CBGs well-controlled 130-170.    S/P bilateral BKA (below knee amputation) ([place]) Peripheral neuropathy (HCC)/Morbid obesity ([place]) For SNF placement.   Family Communication/Anticipated D/C date and plan/Code Status   DVT prophylaxis: Subcutaneous heparin ordered. Code Status: Full Code.  Family Communication: No family at the bedsie. Disposition Plan: SNF when bed available.Medically stable.   Medical Consultants:    Psychiatry   Procedures:    Anti-Infectives:   Anti-infectives    None      Subjective:   [name] feels depressed, but does not have any current suicidal thoughts.  Appetite okay. Bowels moving. No nausea or vomiting. No complaints of chest pain.    Objective:    Vitals:   04/18/16 1053 04/18/16 1329 04/18/16 2131 04/19/16 0450  BP:  (!) 148/76 (!) 155/72 Marland Kitchen)  155/74  Pulse: 63 (!) 59 (!) 59 (!) 57  Resp:  17 18 18   Temp:  98.4 F (36.9 C) 98.1 F (36.7 C) 97.5 F (36.4 C)  TempSrc:  Oral Oral Oral  SpO2:   94% 94% 94%  Weight:    (!) 156.5 kg (345 lb)  Height:        Intake/Output Summary (Last 24 hours) at 04/19/16 0733 Last data filed at 04/18/16 2132  Gross per 24 hour  Intake              720 ml  Output                0 ml  Net              720 ml   Filed Weights   04/11/16 1520 04/19/16 0450  Weight: (!) 160.6 kg (354 lb) (!) 156.5 kg (345 lb)    Exam: General exam: Appears calm and comfortable. Morbidly obese. Respiratory system: Clear to auscultation, but diminished throughout. Respiratory effort normal. Cardiovascular system: S1 & S2 heard, RRR. No JVD,  rubs, gallops or clicks. No murmurs. Gastrointestinal system: Abdomen is nondistended, soft and nontender. No organomegaly or masses felt. Normal bowel sounds heard. Central nervous system: Alert and oriented. No focal neurological deficits. Extremities: Bilateral BKAs. Skin: No rashes, lesions or ulcers. Psychiatry: Judgement and insight appear diminished. Mood & affect depressed/flat.   Data Reviewed:   I have personally reviewed following labs and imaging studies:  Labs: Basic Metabolic Panel: No results for input(s): NA, K, CL, CO2, GLUCOSE, BUN, CREATININE, CALCIUM, MG, PHOS in the last 168 hours. GFR Estimated Creatinine Clearance: 29.2 mL/min (by C-G formula based on SCr of 3.66 mg/dL). Liver Function Tests: No results for input(s): AST, ALT, ALKPHOS, BILITOT, PROT, ALBUMIN in the last 168 hours. No results for input(s): LIPASE, AMYLASE in the last 168 hours. No results for input(s): AMMONIA in the last 168 hours. Coagulation profile No results for input(s): INR, PROTIME in the last 168 hours.  CBC: No results for input(s): WBC, NEUTROABS, HGB, HCT, MCV, PLT in the last 168 hours. CBG:  Recent Labs Lab 04/17/16 2230 04/18/16 0756 04/18/16 1222 04/18/16 1705 04/18/16 2121  GLUCAP 134* 122* 152* 134* 65   Microbiology Recent Results (from the past 240 hour(s))  MRSA PCR Screening     Status: None     Collection Time: 04/11/16  3:18 PM  Result Value Ref Range Status   MRSA by PCR NEGATIVE NEGATIVE Final    Comment:        The GeneXpert MRSA Assay (FDA approved for NASAL specimens only), is one component of a comprehensive MRSA colonization surveillance program. It is not intended to diagnose MRSA infection nor to guide or monitor treatment for MRSA infections.     Radiology: No results found.  Medications:   . aspirin EC  81 mg Oral Daily  . FLUoxetine  20 mg Oral Daily  . fluticasone  1 spray Each Nare BID  . heparin  5,000 Units Subcutaneous Q8H  . hydrALAZINE  25 mg Oral Q8H  . insulin aspart  0-15 Units Subcutaneous TID WC  . insulin detemir  10 Units Subcutaneous BID  . loratadine  10 mg Oral Daily  . metoprolol tartrate  25 mg Oral BID  . pantoprazole  40 mg Oral Daily  . pregabalin  25 mg Oral QHS  . QUEtiapine  25 mg Oral TID   Continuous Infusions:   Time spent:  25 minutes.   LOS: 8 days   [place] [facility] Pager [phone]. If unable to reach me by pager, please call my cell phone at [phone].  *Please refer to [email or web address], password [email or web address] to get updated schedule on who will round on this patient, as hospitalists switch teams weekly. If 7PM-7AM, please contact night-coverage at [email or web address], password [email or web address] for any overnight needs.  04/19/2016, 7:33 AM

## 2016-04-19 NOTE — Progress Notes (Signed)
Physical Therapy Treatment Patient Details Name: [name] MRN: [id number] DOB: Jul 11, 1966 Today's Date: 04/19/2016    History of Present Illness Pt is admitted here for face to face meeting with psychiatrist medical history significant of of type 2 diabetes, hypertension, bilateral BKA, and occasional noncompliance prior to this admission, depression/bipolar disorder with prior suicidal attempt     PT Comments    Assisted pt from EOB to R sidelying with increased time and use of rails.  Positioned to comfort.  Follow Up Recommendations  SNF     Equipment Recommendations       Recommendations for Other Services       Precautions / Restrictions Precautions Precautions: Fall Precaution Comments: Hx B BKA Restrictions Other Position/Activity Restrictions: non amb uses a power scooter for mobility    Mobility  Bed Mobility Overal bed mobility: Needs Assistance Bed Mobility: Rolling;Sidelying to Sit;Supine to Sit;Sit to Supine;Sit to Sidelying Rolling: Mod assist;+2 for physical assistance Sidelying to sit: Mod assist;+2 for physical assistance Supine to sit: Mod assist;+2 for physical assistance Sit to supine: Mod assist;+2 for physical assistance   General bed mobility comments: assisted from EOB back to R sidelying with increased time and use of bed rails  Transfers                    Ambulation/Gait                 Stairs            Wheelchair Mobility    Modified Rankin (Stroke Patients Only)       Balance                                    Cognition Arousal/Alertness: Awake/alert Behavior During Therapy: WFL for tasks assessed/performed Overall Cognitive Status: Within Functional Limits for tasks assessed                      Exercises      General Comments        Pertinent Vitals/Pain Pain Assessment: No/denies pain    Home Living                      Prior Function             PT Goals (current goals can now be found in the care plan section) Progress towards PT goals: Progressing toward goals    Frequency       PT Plan Current plan remains appropriate    Co-evaluation             End of Session   Activity Tolerance: Patient tolerated treatment well Patient left: in bed (EOB)     Time: [id number] PT Time Calculation (min) (ACUTE ONLY): 17 min  Charges:  $Therapeutic Activity: 8-22 mins                    G Codes:      Rica Koyanagi  PTA [facility]  Acute  Rehab Pager      [phone]

## 2016-04-19 NOTE — Progress Notes (Addendum)
LCSWA informed facility [facility] will no longer take patient at this time due to hx of behaviors.  LCSWA will continue to search SNF's LCSWA updated patient and offered support.    Kathrin Greathouse, Latanya Presser, MSW Clinical Social Worker 5E and Psychiatric Service Line [phone] 04/19/2016  9:43 AM

## 2016-04-19 NOTE — Care Management Note (Signed)
Case Management Note  Patient Details  Name: TONYIA REICHLIN MRN: [id number] Date of Birth: Sep 18, 1965  Subjective/Objective:  Per Psych CSW prior SNF [facility] has declined to accept.Psych CSW will continue to search for SNF-MD updated.                  Action/Plan:d/c plan SNF.   Expected Discharge Date:   (unknown)               Expected Discharge Plan:  Skilled Nursing Facility  In-House Referral:  Clinical Social Work  Discharge planning Services  CM Consult  Post Acute Care Choice:    Choice offered to:     DME Arranged:    DME Agency:     HH Arranged:    [place] Agency:     Status of Service:  Completed, signed off  If discussed at [employer] of [employer], dates discussed:    Additional Comments:  Dessa Phi, RN 04/19/2016, 10:22 AM

## 2016-04-20 DIAGNOSIS — E872 Acidosis, unspecified: Secondary | ICD-10-CM

## 2016-04-20 DIAGNOSIS — E875 Hyperkalemia: Secondary | ICD-10-CM

## 2016-04-20 LAB — BASIC METABOLIC PANEL
ANION GAP: 7 (ref 5–15)
Anion gap: 6 (ref 5–15)
BUN: 73 mg/dL — AB (ref 6–20)
BUN: 73 mg/dL — AB (ref 6–20)
CHLORIDE: 108 mmol/L (ref 101–111)
CHLORIDE: 110 mmol/L (ref 101–111)
CO2: 19 mmol/L — ABNORMAL LOW (ref 22–32)
CO2: 19 mmol/L — AB (ref 22–32)
CREATININE: 4.66 mg/dL — AB (ref 0.44–1.00)
Calcium: 8.5 mg/dL — ABNORMAL LOW (ref 8.9–10.3)
Calcium: 8.4 mg/dL — ABNORMAL LOW (ref 8.9–10.3)
Creatinine, Ser: 4.61 mg/dL — ABNORMAL HIGH (ref 0.44–1.00)
GFR calc Af Amer: 12 mL/min — ABNORMAL LOW (ref >60)
GFR calc non Af Amer: 10 mL/min — ABNORMAL LOW (ref >60)
GFR calc non Af Amer: 10 mL/min — ABNORMAL LOW (ref >60)
GFR, EST AFRICAN AMERICAN: 12 mL/min — AB (ref >60)
GLUCOSE: 135 mg/dL — AB (ref 65–99)
Glucose, Bld: 126 mg/dL — ABNORMAL HIGH (ref 65–99)
POTASSIUM: 7.1 mmol/L — AB (ref 3.5–5.1)
POTASSIUM: 7.1 mmol/L — AB (ref 3.5–5.1)
Sodium: 134 mmol/L — ABNORMAL LOW (ref 135–145)
Sodium: 135 mmol/L (ref 135–145)

## 2016-04-20 LAB — GLUCOSE, CAPILLARY
GLUCOSE-CAPILLARY: 144 mg/dL — AB (ref 65–99)
Glucose-Capillary: 131 mg/dL — ABNORMAL HIGH (ref 65–99)
Glucose-Capillary: 126 mg/dL — ABNORMAL HIGH (ref 65–99)
Glucose-Capillary: 124 mg/dL — ABNORMAL HIGH (ref 65–99)

## 2016-04-20 MED ORDER — SODIUM POLYSTYRENE SULFONATE 15 GM/60ML PO SUSP
30.0000 g | Freq: Once | ORAL | Status: AC
  Administered 2016-04-20: 30 g via ORAL
  Filled 2016-04-20: qty 120

## 2016-04-20 MED ORDER — SODIUM BICARBONATE 650 MG PO TABS
650.0000 mg | ORAL_TABLET | Freq: Three times a day (TID) | ORAL | Status: DC
  Administered 2016-04-20 – 2016-04-21 (×5): 650 mg via ORAL
  Filled 2016-04-20 (×5): qty 1

## 2016-04-20 NOTE — Progress Notes (Signed)
CRITICAL VALUE ALERT  Critical value received:  Potassium 7.1  Date of notification:  04/20/16  Time of notification:  2011  Critical value read back:Yes.    Nurse who received alert:  Virgina Norfolk   MD notified (1st page):  Lynch   Time of first page:  2015  MD notified (2nd page):   Time of second page:  Responding MD:  Donnal Debar  Time MD responded:  2016

## 2016-04-20 NOTE — Progress Notes (Signed)
CRITICAL VALUE ALERT  Critical value received:  K+ 7.1  Date of notification:  04/20/16  Time of notification:  1100  Critical value read back:Yes.    Nurse who received alert:  Catie Conley Canal  MD notified (1st page):  Dr. Rockne Menghini  Time of first page:  38  MD notified (2nd page):  Time of second page:  Responding MD:  Dr. Rockne Menghini  Time MD responded:  [phone]

## 2016-04-20 NOTE — Progress Notes (Signed)
Progress Note    [name]  [id number] DOB: [birth year]  DOA: 04/11/2016 PCP: No primary care provider on file.    Brief Narrative:   [name] is an 50 y.o. female  with medical history significant of of type 2 diabetes, hypertension, bilateral BKA, and occasional noncompliance prior to this admission, depression/bipolar disorder with prior suicidal attempt who initially was admitted to [facility] on 04/01/16 for evaluation of chest pain during that visit she also claimed that she had suicidal ideation by overdosing on multiple medications. She was subsequently admitted to [facility], cardiac enzymes remained and negative. Her hospital course was complicated by development of uncontrolled hypertension for which she was treated with multiple antihypertensives and a UTI for which she required intravenous antibiotics. She was followed by tele psychiatry at [facility], initially recommendations were to transfer to an inpatient psychiatric facility, however psychiatry recommended that the patient be transferred to [facility] for face-to-face evaluation by a psychiatrist on 04/11/16.   Assessment/Plan:   Principal Problem:   Chest pain EKG and enzymes were negative at [facility] was admitted [facility] on 7/23 for this primary reason. No further workup planned. She does have advanced chronic kidney disease which limits the ability to do invasive procedures. She has not had any further chest pains. Continue aspirin.   Active Problems:   Hyperkalemia Labs checked today and found to be hyperkalemic. Most likely reflective of her stage IV chronic kidney disease as she is not on any medications or supplements that would raise her potassium. 12-lead EKG negative for peaked T waves. Given 30 g of Kayexalate.    Depression with suicidal ideation No further complaints of suicidal ideation. She does have disposition problems on discharge as well that  may be contributing to her stress. We evaluated by psychiatrist on 04/17/16. Did not meet criteria for acute psychiatric hospitalization. Safety sitter discontinued as the patient contracted for safety and did not have an active suicidal plan. Continue Fluoxetine 20 mg and Seroquel 25 mg 3 times a day. Continues to deny suicidal thoughts/intent.    CKD (chronic kidney disease) stage 4, GFR 15-29 ml/min (HCC) complicated by hyperkalemia and metabolic acidosis Baseline creatinine is approximately 3.5 during her stay at [facility], but unclear if this is her typical baseline. Pt says she has never seen a nephrologist.  Would schedule outpatient appointment with [facility] at discharge.  Pt says that she would never consider dialysis even if it means death but given her current severe depression she could change her feelings about this after treatment. Given the development of hyperkalemia, may need to involve nephrology prior to discharge. We'll place on oral sodium bicarbonate supplementation as well.    HTN (hypertension) Controlled at this time.  Continue metoprolol.       DM type 2 ([place]) with multiple complications Currently being managed with 10 units of Levemir daily and moderate scale SSI 3 times a day. CBGs 93-174.    S/P bilateral BKA (below knee amputation) ([place]) Peripheral neuropathy (HCC)/Morbid obesity ([place]) For SNF placement.   Family Communication/Anticipated D/C date and plan/Code Status   DVT prophylaxis: Subcutaneous heparin ordered. Code Status: Full Code.  Family Communication: No family at the bedsie. Disposition Plan: SNF when bed available.Medically stable.   Medical Consultants:    Psychiatry   Procedures:    Anti-Infectives:   Anti-infectives    None      Subjective:   [name] refused to acknowledge  or talk to me when I initially entered her room. After multiple attempts to engage her, she finally responded. When asked why she refused  to talk, she stated "I thought you were coming in to tell me some bullshit." No physical complaints including nausea, vomiting or chest pain.    Objective:    Vitals:   04/19/16 2137 04/20/16 0506 04/20/16 1112 04/20/16 1436  BP: 139/62 129/77  116/70  Pulse: (!) 59 (!) 51 63 (!) 57  Resp: 16 18 18 18   Temp: 97.7 F (36.5 C) 97.1 F (36.2 C)  98.4 F (36.9 C)  TempSrc: Oral Oral  Oral  SpO2: 100% 100% 99% 100%  Weight:      Height:        Intake/Output Summary (Last 24 hours) at 04/20/16 1532 Last data filed at 04/20/16 0915  Gross per 24 hour  Intake             1200 ml  Output                0 ml  Net             1200 ml   Filed Weights   04/11/16 1520 04/19/16 0450  Weight: (!) 160.6 kg (354 lb) (!) 156.5 kg (345 lb)    Exam: General exam: Appears calm and comfortable. Morbidly obese. Respiratory system: Clear to auscultation, but diminished throughout. Respiratory effort normal. Cardiovascular system: S1 & S2 heard, RRR. No JVD,  rubs, gallops or clicks. No murmurs. Gastrointestinal system: Abdomen is nondistended, soft and nontender. No organomegaly or masses felt. Normal bowel sounds heard. Central nervous system: Alert and oriented. No focal neurological deficits. Extremities: Bilateral BKAs. Skin: No rashes, lesions or ulcers. Psychiatry: Judgement and insight appear diminished. Mood & affect depressed/flat.   Data Reviewed:   I have personally reviewed following labs and imaging studies:  Labs: Basic Metabolic Panel:  Recent Labs Lab 04/20/16 1021  NA 134*  K 7.1*  CL 108  CO2 19*  GLUCOSE 135*  BUN 73*  CREATININE 4.61*  CALCIUM 8.5*   GFR Estimated Creatinine Clearance: 23.2 mL/min (by C-G formula based on SCr of 4.61 mg/dL). Liver Function Tests: No results for input(s): AST, ALT, ALKPHOS, BILITOT, PROT, ALBUMIN in the last 168 hours. No results for input(s): LIPASE, AMYLASE in the last 168 hours. No results for input(s): AMMONIA in the  last 168 hours. Coagulation profile No results for input(s): INR, PROTIME in the last 168 hours.  CBC: No results for input(s): WBC, NEUTROABS, HGB, HCT, MCV, PLT in the last 168 hours. CBG:  Recent Labs Lab 04/19/16 1244 04/19/16 1714 04/19/16 2120 04/20/16 0828 04/20/16 1210  GLUCAP 146* 135* 174* 131* 126*   Microbiology Recent Results (from the past 240 hour(s))  MRSA PCR Screening     Status: None   Collection Time: 04/11/16  3:18 PM  Result Value Ref Range Status   MRSA by PCR NEGATIVE NEGATIVE Final    Comment:        The GeneXpert MRSA Assay (FDA approved for NASAL specimens only), is one component of a comprehensive MRSA colonization surveillance program. It is not intended to diagnose MRSA infection nor to guide or monitor treatment for MRSA infections.     Radiology: No results found.  Medications:   . aspirin EC  81 mg Oral Daily  . FLUoxetine  20 mg Oral Daily  . fluticasone  1 spray Each Nare BID  . heparin  5,000 Units Subcutaneous  Q8H  . hydrALAZINE  25 mg Oral Q8H  . insulin aspart  0-15 Units Subcutaneous TID WC  . insulin detemir  10 Units Subcutaneous BID  . loratadine  10 mg Oral Daily  . metoprolol tartrate  25 mg Oral BID  . pantoprazole  40 mg Oral Daily  . pregabalin  25 mg Oral QHS  . QUEtiapine  25 mg Oral TID   Continuous Infusions:   Time spent: 25 minutes.   LOS: 9 days   [place] [facility] Pager [phone]. If unable to reach me by pager, please call my cell phone at [phone].  *Please refer to [email or web address], password [email or web address] to get updated schedule on who will round on this patient, as hospitalists switch teams weekly. If 7PM-7AM, please contact night-coverage at [email or web address], password [email or web address] for any overnight needs.  04/20/2016, 3:32 PM

## 2016-04-21 DIAGNOSIS — Z515 Encounter for palliative care: Secondary | ICD-10-CM

## 2016-04-21 DIAGNOSIS — Z7189 Other specified counseling: Secondary | ICD-10-CM

## 2016-04-21 LAB — BASIC METABOLIC PANEL
ANION GAP: 6 (ref 5–15)
ANION GAP: 6 (ref 5–15)
BUN: 67 mg/dL — ABNORMAL HIGH (ref 6–20)
BUN: 64 mg/dL — ABNORMAL HIGH (ref 6–20)
CHLORIDE: 110 mmol/L (ref 101–111)
CO2: 19 mmol/L — AB (ref 22–32)
CO2: 20 mmol/L — AB (ref 22–32)
Calcium: 8.1 mg/dL — ABNORMAL LOW (ref 8.9–10.3)
Calcium: 8.2 mg/dL — ABNORMAL LOW (ref 8.9–10.3)
Chloride: 111 mmol/L (ref 101–111)
Creatinine, Ser: 4.67 mg/dL — ABNORMAL HIGH (ref 0.44–1.00)
Creatinine, Ser: 4.49 mg/dL — ABNORMAL HIGH (ref 0.44–1.00)
GFR calc Af Amer: 12 mL/min — ABNORMAL LOW (ref >60)
GFR calc Af Amer: 12 mL/min — ABNORMAL LOW (ref >60)
GFR calc non Af Amer: 10 mL/min — ABNORMAL LOW (ref >60)
GFR calc non Af Amer: 11 mL/min — ABNORMAL LOW (ref >60)
GLUCOSE: 98 mg/dL (ref 65–99)
GLUCOSE: 86 mg/dL (ref 65–99)
POTASSIUM: 6.3 mmol/L — AB (ref 3.5–5.1)
POTASSIUM: 6.3 mmol/L — AB (ref 3.5–5.1)
Sodium: 135 mmol/L (ref 135–145)
Sodium: 137 mmol/L (ref 135–145)

## 2016-04-21 LAB — GLUCOSE, CAPILLARY
Glucose-Capillary: 100 mg/dL — ABNORMAL HIGH (ref 65–99)
Glucose-Capillary: 115 mg/dL — ABNORMAL HIGH (ref 65–99)
Glucose-Capillary: 127 mg/dL — ABNORMAL HIGH (ref 65–99)
Glucose-Capillary: 146 mg/dL — ABNORMAL HIGH (ref 65–99)

## 2016-04-21 MED ORDER — SODIUM POLYSTYRENE SULFONATE 15 GM/60ML PO SUSP
45.0000 g | Freq: Once | ORAL | Status: AC
  Administered 2016-04-21: 45 g via ORAL
  Filled 2016-04-21: qty 180

## 2016-04-21 MED ORDER — SODIUM POLYSTYRENE SULFONATE 15 GM/60ML PO SUSP
45.0000 g | Freq: Two times a day (BID) | ORAL | Status: DC
  Administered 2016-04-21 – 2016-04-22 (×3): 45 g via ORAL
  Filled 2016-04-21 (×5): qty 180

## 2016-04-21 MED ORDER — GUAIFENESIN-DM 100-10 MG/5ML PO SYRP
5.0000 mL | ORAL_SOLUTION | ORAL | Status: DC | PRN
  Administered 2016-04-21 – 2016-04-25 (×9): 5 mL via ORAL
  Filled 2016-04-21 (×9): qty 10

## 2016-04-21 MED ORDER — SODIUM POLYSTYRENE SULFONATE 15 GM/60ML PO SUSP
15.0000 g | Freq: Once | ORAL | Status: AC
  Administered 2016-04-21: 15 g via ORAL
  Filled 2016-04-21: qty 60

## 2016-04-21 MED ORDER — SODIUM CHLORIDE 0.45 % IV SOLN
INTRAVENOUS | Status: DC
  Administered 2016-04-21 – 2016-04-22 (×3): via INTRAVENOUS

## 2016-04-21 NOTE — Care Management Note (Signed)
Case Management Note  Patient Details  Name: [name] MRN: [id number] Date of Birth: [birth year]  Subjective/Objective:     Depression, SI, hypekalemia               Action/Plan: Discharge Planning: Chart reviewed. Plan dc to SNF. CSW following for SNF placement.   Expected Discharge Date:              Expected Discharge Plan:  Skilled Nursing Facility  In-House Referral:  Clinical Social Work  Discharge planning Services  CM Consult  Post Acute Care Choice:  NA Choice offered to:  NA  DME Arranged:    DME Agency:  NA  HH Arranged:  NA HH Agency:  NA  Status of Service:  Completed, signed off  If discussed at [employer] of [employer] Meetings, dates discussed:    Additional Comments:  Erenest Rasher, RN 04/21/2016, 10:18 AM

## 2016-04-21 NOTE — Progress Notes (Signed)
CRITICAL VALUE ALERT  Critical value received:  Potassium 6.3  Date of notification:  04/21/16  Time of notification:  0735  Critical value read back:Yes.    Nurse who received alert:  Catie Conley Canal  MD notified (1st page):  Dr. Rockne Menghini  Time of first page:  0740  MD notified (2nd page):  Time of second page:  Responding MD:  Dr. Rockne Menghini  Time MD responded:  [phone]

## 2016-04-21 NOTE — Consult Note (Addendum)
Renal Service Consult Note [facility]  [name] 04/21/2016 [place] Requesting Physician:  Dr Rockne Menghini  Reason for Consult:  CKD patient  HPI: The patient is a 50 y.o. year-old with history of HTN, DM2, obesity, PVD w bilat BKA and progressive CKD now stage 4.  Patient presented to [facility] with chest pain, w/u was negative but she was also having depression / anxiety/ suicidal ideation so was transferred up to [facility] for psych Rx.  Arrived here on 8/2 with creat 3.66.  Was treated by psychiatry and improved and was close to dc but at the last minute the SNF that agreed to take her changed their mind and dc was cancelled. In the meantime new labs showed creat 4. 4 and K 7.3.  Started on kayexalate and K+ down to 6. 3 today.  Asked to see for CKD.    Patient says her kidneys have been "bad" for years, they used to be "a 2.0" and "they said if it got up to 5-6 , I would need dialysis".  Does not see a kidney doctor.    Grew up in [place], [place], went to [employer] and ran track.  Went two years to [employer] in [place], then quit college and started working as an Engineer, production in rest homes/ nursing homes, and as a Training and development officer.  Her mother and her stepfather moved to [place] and she followed; they then separated and pt and her mother both moved back to [place] around 89.  Pt had health probs in the 61's w asthma/ diabetes and went onto disability.  Had two daughters one in 44 and the other in 60.  In Nov 25, 1995 her mother died while on dialysis and with DM in [place].  In Nov 24, 1998 her partner/ daughter's father died.  They had been together 15 yrs. She became severely depressed and that year she went into psych facility for the first time. Since then she has been hospitalized in psychiatric facilities about 5 times altogether.    Patient is bilat amputee, gets around in a motorized WC and uses sliding boards to transfer.  She has recently been SNF dependent , awaiting one of her daughters  to find a place for them to live.     Date  Creat  eGFR 2007/11/25  1.9- 2.4 28- 34 04/12/16  3.66  16 04/21/16 4.49  12  Admits: 11-25-2007 - polymicrobial L foot wound infection > had I&D x 2, abx, may need BKA soon/ DM/ MO/ CKD Cr 1.9- 2.9/ anemia/ MRSA bacteremia/ anxiety / depression/ sever diab neuropathy LE's 8/2 - 04/17/16 > CP, suicidal ideation, drug OD, HTN urgency, UTI, CKD 4. Rx w telepsychiatry  ROS  denies CP  no joint pain   no HA  no blurry vision  no rash  no diarrhea  no nausea/ vomiting  no dysuria  no difficulty voiding  no change in urine color    Past Medical History  Past Medical History:  Diagnosis Date  . Arthritis   . Asthma   . Diabetes mellitus without complication ([place])   . GERD (gastroesophageal reflux disease)   . Hypertension   . Suicide attempt [facility])    Past Surgical History  Past Surgical History:  Procedure Laterality Date  . CHOLECYSTECTOMY     Family History  Family History  Problem Relation Age of Onset  . Diabetes Mother   . Kidney failure Mother   . Diabetes Father   . Hypertension Father  Social History  reports that she has never smoked. She has never used smokeless tobacco. She reports that she drinks alcohol. She reports that she does not use drugs. Allergies No Known Allergies Home medications Prior to Admission medications   Medication Sig Start Date End Date Taking? Authorizing Provider  acetaminophen (TYLENOL) 650 MG CR tablet Take 650 mg by mouth every 6 (six) hours as needed for pain or fever.   Yes Historical Provider, MD  aspirin 81 MG chewable tablet Chew 81 mg by mouth daily.   Yes Historical Provider, MD  fluticasone (FLONASE) 50 MCG/ACT nasal spray Place 1 spray into both nostrils 2 (two) times daily.   Yes Historical Provider, MD  heparin 5000 UNIT/ML injection Inject 5,000 Units into the skin every 8 (eight) hours.   Yes Historical Provider, MD  insulin detemir (LEVEMIR) 100 UNIT/ML injection Inject 10 Units into  the skin 2 (two) times daily.   Yes Historical Provider, MD  insulin regular (NOVOLIN R,HUMULIN R) 100 units/mL injection Inject into the skin 4 (four) times daily as needed for high blood sugar. Pt uses as needed per sliding scale before meals and at bedtime.   Yes Historical Provider, MD  loratadine (CLARITIN) 10 MG tablet Take 10 mg by mouth daily.   Yes Historical Provider, MD  metoprolol tartrate (LOPRESSOR) 25 MG tablet Take 25 mg by mouth 2 (two) times daily.   Yes Historical Provider, MD  pregabalin (LYRICA) 25 MG capsule Take 25 mg by mouth at bedtime.   Yes Historical Provider, MD  QUEtiapine (SEROQUEL) 25 MG tablet Take 25 mg by mouth 3 (three) times daily.   Yes Historical Provider, MD  FLUoxetine (PROZAC) 20 MG tablet Take 1 tablet (20 mg total) by mouth daily. 04/17/16   Clanford Marisa Hua, MD  hydrALAZINE (APRESOLINE) 25 MG tablet Take 1 tablet (25 mg total) by mouth every 8 (eight) hours. 04/17/16   Clanford Marisa Hua, MD  ipratropium-albuterol (DUONEB) 0.5-2.5 (3) MG/3ML SOLN Take 3 mLs by nebulization every 4 (four) hours as needed (for wheezing/shortness of breath). 04/17/16   Clanford Marisa Hua, MD   Liver Function Tests No results for input(s): AST, ALT, ALKPHOS, BILITOT, PROT, ALBUMIN in the last 168 hours. No results for input(s): LIPASE, AMYLASE in the last 168 hours. CBC No results for input(s): WBC, NEUTROABS, HGB, HCT, MCV, PLT in the last 168 hours. Basic Metabolic Panel  Recent Labs Lab 04/20/16 1021 04/20/16 1926 04/21/16 0137 04/21/16 0602  NA 134* 135 135 137  K 7.1* 7.1* 6.3* 6.3*  CL 108 110 110 111  CO2 19* 19* 19* 20*  GLUCOSE 135* 126* 98 86  BUN 73* 73* 67* 64*  CREATININE 4.61* 4.66* 4.67* 4.49*  CALCIUM 8.5* 8.4* 8.1* 8.2*   Iron/TIBC/Ferritin/ %Sat No results found for: IRON, TIBC, FERRITIN, IRONPCTSAT  Vitals:   04/21/16 0336 04/21/16 0932 04/21/16 1330 04/21/16 1450  BP: 139/81 (!) 153/89 (!) 180/97 (!) 149/95  Pulse: (!) 57 67 61   Resp: 16   16   Temp: 97.5 F (36.4 C)  98 F (36.7 C)   TempSrc: Oral  Oral   SpO2: 97%  97%   Weight:      Height:       Exam Gen morbid obesity, alert, pleasant No rash, cyanosis or gangrene Sclera anicteric, throat clear  No jvd or bruits Chest clear bilat RRR no MRG  Abd soft ntnd no mass or ascites +bs marked obesity GU defer MS no joint effusions or  deformity, bilat BKA well healed Ext no sig LE or UE edema / no wounds or ulcers Neuro is alert, Ox 3 , nf  Na 137  K 6.3  BUN 64  Cr 4.49   CO2 20   Ca 8.2  eGFR 12   Assessment: 1  CKD 4  - possible component of vol depletion w Cr bump and hyperkalemia.  Will start some IVF's. No uremic symptoms.  2  Hyperkalemia - increase kayexalate, cont renal diet 3  HTN - on MTP and hydralazine, BP' s on the high side 4  Volume - no edema on exam 5  Morbid obesity 6  GOC - being seen by pall care team 7  Depression / anxiety 8  Asthma 9  DM2 w neuropathy   Plan - give kayexalate 45 gm bid, renal diet, cautious IVF"s  Kelly Splinter MD [place] pager [phone]    cell [phone] 04/21/2016, 7:34 PM

## 2016-04-21 NOTE — Progress Notes (Signed)
CRITICAL VALUE ALERT  Critical value received:  Potassium 6.3  Date of notification:  04/21/16  Time of notification:  [id number]  Critical value read back:Yes.    Nurse who received alert:  Virgina Norfolk  MD notified (1st page):  Lynch   Time of first page:  0241  MD notified (2nd page):  Time of second page:  Responding MD:  lynch  Time MD responded:  [phone]

## 2016-04-21 NOTE — Progress Notes (Addendum)
LCSWA met with patient at side, pt. eating breakfast. Patient reports she is feeling better and is hopeful to go to a SNF. Patients reports no concerns at this time.   LCSWA to continue search for SNF.

## 2016-04-21 NOTE — Progress Notes (Signed)
Progress Note    [name]  [id number] DOB: 1966/01/29  DOA: 04/11/2016 PCP: No primary care provider on file.    Brief Narrative:   [name] is an 50 y.o. female  with medical history significant of of type 2 diabetes, hypertension, bilateral BKA, and occasional noncompliance prior to this admission, depression/bipolar disorder with prior suicidal attempt who initially was admitted to [facility] on 04/01/16 for evaluation of chest pain during that visit she also claimed that she had suicidal ideation by overdosing on multiple medications. She was subsequently admitted to [facility], cardiac enzymes remained and negative. Her hospital course was complicated by development of uncontrolled hypertension for which she was treated with multiple antihypertensives and a UTI for which she required intravenous antibiotics. She was followed by tele psychiatry at [facility], initially recommendations were to transfer to an inpatient psychiatric facility, however psychiatry recommended that the patient be transferred to [facility] for face-to-face evaluation by a psychiatrist on 04/11/16.   Assessment/Plan:   Principal Problem:   Chest pain EKG and enzymes were negative at [facility] was admitted [facility] on 7/23 for this primary reason. No further workup planned. She does have advanced chronic kidney disease which limits the ability to do invasive procedures. She has not had any further chest pains. Continue aspirin.   Active Problems:   Hyperkalemia in the setting of stage IV chronic kidney disease Labs checked today and found to be hyperkalemic. Most likely reflective of her stage IV chronic kidney disease as she is not on any medications or supplements that would raise her potassium. 12-lead EKG negative for peaked T waves. Given 30 g of Kayexalate. Potassium still elevated this morning, will repeat Kayexalate.    Depression with suicidal  ideation No further complaints of suicidal ideation. She does have disposition problems on discharge as well that may be contributing to her stress. We evaluated by psychiatrist on 04/17/16. Did not meet criteria for acute psychiatric hospitalization. Safety sitter discontinued as the patient contracted for safety and did not have an active suicidal plan. Continue Fluoxetine 20 mg and Seroquel 25 mg 3 times a day. Continues to deny suicidal thoughts/intent.    CKD (chronic kidney disease) stage 4, GFR 15-29 ml/min (HCC) complicated by hyperkalemia and metabolic acidosis Baseline creatinine is approximately 3.5 during her stay at [facility], but unclear if this is her typical baseline. Pt says she has never seen a nephrologist.  Would schedule outpatient appointment with [facility] at discharge.  Pt says that she would never consider dialysis even if it means death but given her current severe depression she could change her feelings about this after treatment. Given the development of hyperkalemia, I have consulted nephrology as well as palliative care.    HTN (hypertension) Controlled at this time.  Continue metoprolol.      DM type 2 ([place]) with multiple complications Currently being managed with 10 units of Levemir daily and moderate scale SSI 3 times a day. CBGs 93-174.    S/P bilateral BKA (below knee amputation) ([place]) Peripheral neuropathy (HCC)/Morbid obesity ([place]) For SNF placement.   Family Communication/Anticipated D/C date and plan/Code Status   DVT prophylaxis: Subcutaneous heparin ordered. Code Status: Full Code.  Family Communication: No family at the bedsie. Disposition Plan: SNF when bed available.Medically stable.   Medical Consultants:    Psychiatry   Procedures:    Anti-Infectives:   Anti-infectives    None      Subjective:  [name] denies any nausea, vomiting, or pain. Her bowels are moving and her appetite is fair. She continues to  have a depressed affect but denies suicidal thoughts, and now says she would be willing to consider dialysis, though she would not want this long-term. Has a new cough.  Objective:    Vitals:   04/20/16 1112 04/20/16 1436 04/20/16 2135 04/21/16 0336  BP:  116/70 (!) 153/92 139/81  Pulse: 63 (!) 57 (!) 57 (!) 57  Resp: 18 18 16 16   Temp:  98.4 F (36.9 C) 97.7 F (36.5 C) 97.5 F (36.4 C)  TempSrc:  Oral Oral Oral  SpO2: 99% 100% 98% 97%  Weight:      Height:        Intake/Output Summary (Last 24 hours) at 04/21/16 0826 Last data filed at 04/20/16 2300  Gross per 24 hour  Intake             1200 ml  Output                0 ml  Net             1200 ml   Filed Weights   04/11/16 1520 04/19/16 0450  Weight: (!) 160.6 kg (354 lb) (!) 156.5 kg (345 lb)    Exam: General exam: Appears calm and comfortable. Morbidly obese. Respiratory system: New rhonchi. Respiratory effort normal. Cardiovascular system: S1 & S2 heard, RRR. No JVD,  rubs, gallops or clicks. II/VI SEM. Gastrointestinal system: Abdomen is nondistended, soft and nontender. No organomegaly or masses felt. Normal bowel sounds heard. Central nervous system: Alert and oriented. No focal neurological deficits. Extremities: Bilateral BKAs. Skin: No rashes, lesions or ulcers. Psychiatry: Judgement and insight appear diminished. Mood & affect depressed/flat.   Data Reviewed:   I have personally reviewed following labs and imaging studies:  Labs: Basic Metabolic Panel:  Recent Labs Lab 04/20/16 1021 04/20/16 1926 04/21/16 0137 04/21/16 0602  NA 134* 135 135 137  K 7.1* 7.1* 6.3* 6.3*  CL 108 110 110 111  CO2 19* 19* 19* 20*  GLUCOSE 135* 126* 98 86  BUN 73* 73* 67* 64*  CREATININE 4.61* 4.66* 4.67* 4.49*  CALCIUM 8.5* 8.4* 8.1* 8.2*   GFR Estimated Creatinine Clearance: 23.8 mL/min (by C-G formula based on SCr of 4.49 mg/dL). Liver Function Tests: No results for input(s): AST, ALT, ALKPHOS, BILITOT,  PROT, ALBUMIN in the last 168 hours. No results for input(s): LIPASE, AMYLASE in the last 168 hours. No results for input(s): AMMONIA in the last 168 hours. Coagulation profile No results for input(s): INR, PROTIME in the last 168 hours.  CBC: No results for input(s): WBC, NEUTROABS, HGB, HCT, MCV, PLT in the last 168 hours. CBG:  Recent Labs Lab 04/20/16 0828 04/20/16 1210 04/20/16 1618 04/20/16 2138 04/21/16 0754  GLUCAP 131* 126* 144* 124* 100*   Microbiology Recent Results (from the past 240 hour(s))  MRSA PCR Screening     Status: None   Collection Time: 04/11/16  3:18 PM  Result Value Ref Range Status   MRSA by PCR NEGATIVE NEGATIVE Final    Comment:        The GeneXpert MRSA Assay (FDA approved for NASAL specimens only), is one component of a comprehensive MRSA colonization surveillance program. It is not intended to diagnose MRSA infection nor to guide or monitor treatment for MRSA infections.     Radiology: No results found.  Medications:   . aspirin EC  81  mg Oral Daily  . FLUoxetine  20 mg Oral Daily  . fluticasone  1 spray Each Nare BID  . heparin  5,000 Units Subcutaneous Q8H  . hydrALAZINE  25 mg Oral Q8H  . insulin aspart  0-15 Units Subcutaneous TID WC  . insulin detemir  10 Units Subcutaneous BID  . loratadine  10 mg Oral Daily  . metoprolol tartrate  25 mg Oral BID  . pantoprazole  40 mg Oral Daily  . pregabalin  25 mg Oral QHS  . QUEtiapine  25 mg Oral TID  . sodium bicarbonate  650 mg Oral TID  . sodium polystyrene  45 g Oral Once   Continuous Infusions:   Time spent: 25 minutes.   LOS: 10 days   [place] [facility] Pager [phone]. If unable to reach me by pager, please call my cell phone at [phone].  *Please refer to [email or web address], password [email or web address] to get updated schedule on who will round on this patient, as hospitalists switch teams weekly. If 7PM-7AM, please contact night-coverage at [email or web address], password [email or web address] for  any overnight needs.  04/21/2016, 8:26 AM

## 2016-04-21 NOTE — Consult Note (Signed)
Consultation Note Date: 04/21/2016   Patient Name: [name]  DOB: July 19, 1966  MRN: [id number]  Age / Sex: 50 y.o., female  PCP: No primary care provider on file. Referring Physician: Venetia Maxon Rama, MD  Reason for Consultation: Establishing goals of care  HPI/Patient Profile: 50 y.o. female   admitted on 04/11/2016     Clinical Assessment and Goals of Care:   50 year old lady who lives in [place]. She has been admitted from [facility] where she presented with chest pains. She has underlying history of diabetes, hypertension. She has had bilateral below-the-knee amputations. She has underlying history of depression/bipolar disorder with a history of prior suicidal attempt. Shunt was transferred to [facility] for face-to-face evaluation with the psychiatrist on 04-11-16. She has been seen and evaluated by psychiatry here. She has had adjustment of her fluoxetine and Seroquel doses. Sitter was discontinued after she denied suicidal thoughts/intent. Hospital course complicated by elevated serum creatinine and elevated potassium levels. Palliative care consultation requested for goals of care discussions.  Patient is resting in bed. She states that she did not sleep well overnight. She is finishing her breakfast. Bedside nurse present in the room. She has been given Kayexalate and Lantus. She is awake and alert. She does have a flat affect.  I introduced myself and palliative care as follows: Palliative medicine is specialized medical care for people living with serious illness. It focuses on providing relief from the symptoms and stress of a serious illness. The goal is to improve quality of life for both the patient and the family.  Patient states that she has been told by clinical staff at [facility] that she has less than 6 months to live. She understands that this  was probably said because of her worsening kidney function.  Goals and wishes attempted to be elicited. Patient initially did not wish to disclose much, however, later on in the course of the conversation, did open up and began to have conversations with me. She stated that she had a strong faith and that she has belief that she will be healed. CODE STATUS discussions undertaken. Patient wishes to remain a full code. In the event that she stops breathing or her heart stops, she would like to be resuscitated. She wishes to undergo full scope of resuscitative attempt-CPR, mechanical ventilation, endotracheal intubation, shocks if indicated. Hence, further discussions undertaken with regards to advance care planning in light of her worsening kidney function. Discussed that she likely has underlying chronic kidney disease from her diabetes and hypertension. Advance care planning discussions undertaken particularly focusing on her acute kidney disease and decisions at hand: No dialysis, different types of dialysis, to high likelihood of her requiring dialysis for end-stage renal disease in the future. Extensive discussions undertaken. All questions answered. Patient ultimately is agreeable to discuss further with a nephrologist. She is agreeable to continuing Kayexalate therapy. She states she has multiple psychosocial and financial stressors but does not elaborate further. See recommendations below. We will continue to follow  along. Thank you for the consult.  NEXT OF KIN  has 2 daughters.   SUMMARY OF RECOMMENDATIONS    1. Wants to remain full code 2. She is agreeable to discuss further with a nephrologist about her worsening renal function, hyperkalemia. She is even amenable to considering time limited trial of dialysis. She does not think she would want to be on dialysis long term. We will continue to follow along and help guide decision-making Code Status/Advance Care Planning:  Full code    Symptom  Management:    continue current treatment.   Palliative Prophylaxis:   Bowel Regimen  Additional Recommendations (Limitations, Scope, Preferences):  Full Scope Treatment  Psycho-social/Spiritual:   Desire for further Chaplaincy support:no  Additional Recommendations: Caregiving  Support/Resources  Prognosis:   Unable to determine  Discharge Planning: SNF      Primary Diagnoses: Present on Admission: . Chest pain . Depression . CKD (chronic kidney disease) stage 4, GFR 15-29 ml/min (HCC) . HTN (hypertension) . DM coma, type 2 ([place]) . Morbid obesity ([place])   I have reviewed the medical record, interviewed the patient and family, and examined the patient. The following aspects are pertinent.  Past Medical History:  Diagnosis Date  . Arthritis   . Asthma   . Diabetes mellitus without complication ([place])   . GERD (gastroesophageal reflux disease)   . Hypertension   . Suicide attempt [facility])    Social History   Social History  . Marital status: Single    Spouse name: N/A  . Number of children: N/A  . Years of education: N/A   Social History Main Topics  . Smoking status: Never Smoker  . Smokeless tobacco: Never Used  . Alcohol use Yes     Comment: Occasional  . Drug use: No  . Sexual activity: Not Asked   Other Topics Concern  . None   Social History Narrative  . None   Family History  Problem Relation Age of Onset  . Diabetes Mother   . Kidney failure Mother   . Diabetes Father   . Hypertension Father    Scheduled Meds: . aspirin EC  81 mg Oral Daily  . FLUoxetine  20 mg Oral Daily  . fluticasone  1 spray Each Nare BID  . heparin  5,000 Units Subcutaneous Q8H  . hydrALAZINE  25 mg Oral Q8H  . insulin aspart  0-15 Units Subcutaneous TID WC  . insulin detemir  10 Units Subcutaneous BID  . loratadine  10 mg Oral Daily  . metoprolol tartrate  25 mg Oral BID  . pantoprazole  40 mg Oral Daily  . pregabalin  25 mg Oral QHS  . QUEtiapine  25 mg  Oral TID  . sodium bicarbonate  650 mg Oral TID   Continuous Infusions:  PRN Meds:.acetaminophen **OR** acetaminophen, albuterol, guaiFENesin-dextromethorphan, ondansetron Medications Prior to Admission:  Prior to Admission medications   Medication Sig Start Date End Date Taking? Authorizing Provider  acetaminophen (TYLENOL) 650 MG CR tablet Take 650 mg by mouth every 6 (six) hours as needed for pain or fever.   Yes Historical Provider, MD  aspirin 81 MG chewable tablet Chew 81 mg by mouth daily.   Yes Historical Provider, MD  fluticasone (FLONASE) 50 MCG/ACT nasal spray Place 1 spray into both nostrils 2 (two) times daily.   Yes Historical Provider, MD  heparin 5000 UNIT/ML injection Inject 5,000 Units into the skin every 8 (eight) hours.   Yes Historical Provider, MD  insulin detemir (  LEVEMIR) 100 UNIT/ML injection Inject 10 Units into the skin 2 (two) times daily.   Yes Historical Provider, MD  insulin regular (NOVOLIN R,HUMULIN R) 100 units/mL injection Inject into the skin 4 (four) times daily as needed for high blood sugar. Pt uses as needed per sliding scale before meals and at bedtime.   Yes Historical Provider, MD  loratadine (CLARITIN) 10 MG tablet Take 10 mg by mouth daily.   Yes Historical Provider, MD  metoprolol tartrate (LOPRESSOR) 25 MG tablet Take 25 mg by mouth 2 (two) times daily.   Yes Historical Provider, MD  pregabalin (LYRICA) 25 MG capsule Take 25 mg by mouth at bedtime.   Yes Historical Provider, MD  QUEtiapine (SEROQUEL) 25 MG tablet Take 25 mg by mouth 3 (three) times daily.   Yes Historical Provider, MD  FLUoxetine (PROZAC) 20 MG tablet Take 1 tablet (20 mg total) by mouth daily. 04/17/16   Clanford Marisa Hua, MD  hydrALAZINE (APRESOLINE) 25 MG tablet Take 1 tablet (25 mg total) by mouth every 8 (eight) hours. 04/17/16   Clanford Marisa Hua, MD  ipratropium-albuterol (DUONEB) 0.5-2.5 (3) MG/3ML SOLN Take 3 mLs by nebulization every 4 (four) hours as needed (for  wheezing/shortness of breath). 04/17/16   Clanford Marisa Hua, MD   No Known Allergies Review of Systems + for depression, generalized weakness.   Physical Exam Obese young lady resting in bed Edema 2+ bilateral UE S/p bilateral BKA S1 S2 Abdomen distended Awake alert oriented Flat affect   Vital Signs: BP (!) 153/89   Pulse 67   Temp 97.5 F (36.4 C) (Oral)   Resp 16   Ht 5\' 7"  (1.702 m) Comment: Below the kneww amputation  Wt (!) 156.5 kg (345 lb)   LMP 04/11/2008 (Approximate)   SpO2 97%   BMI 54.03 kg/m  Pain Assessment: No/denies pain   Pain Score: Asleep   SpO2: SpO2: 97 % O2 Device:SpO2: 97 % O2 Flow Rate: .O2 Flow Rate (L/min): 2 L/min  IO: Intake/output summary:   Intake/Output Summary (Last 24 hours) at 04/21/16 1059 Last data filed at 04/20/16 2300  Gross per 24 hour  Intake              960 ml  Output                0 ml  Net              960 ml    LBM: Last BM Date: 04/20/16 Baseline Weight: Weight: (!) 160.6 kg (354 lb) Most recent weight: Weight: (!) 156.5 kg (345 lb)     Palliative Assessment/Data:   Flowsheet Rows   Flowsheet Row Most Recent Value  Intake Tab  Referral Department  Hospitalist  Unit at Time of Referral  Cardiac/Telemetry Unit  Palliative Care Primary Diagnosis  Nephrology  Palliative Care Type  New Palliative care  Reason for referral  Clarify Goals of Care  Date first seen by Palliative Care  04/21/16  Clinical Assessment  Palliative Performance Scale Score  40%  Pain Max last 24 hours  5  Pain Min Last 24 hours  4  Dyspnea Max Last 24 Hours  5  Dyspnea Min Last 24 hours  4  Nausea Max Last 24 Hours  5  Nausea Min Last 24 Hours  4  Psychosocial & Spiritual Assessment  Palliative Care Outcomes  Patient/Family meeting held?  Yes  Who was at the meeting?  patient herself, she is decisional.   Palliative  Care Outcomes  Clarified goals of care      Time In: 9 am  Time Out: 10 Time Total: 60  Greater than 50%  of  this time was spent counseling and coordinating care related to the above assessment and plan.  Signed by: Loistine Chance, MD  [id number] Please contact Palliative Medicine Team phone at [phone] for questions and concerns.  For individual provider: See Amion password [facility]

## 2016-04-22 ENCOUNTER — Inpatient Hospital Stay (HOSPITAL_COMMUNITY): Payer: Medicaid Other

## 2016-04-22 LAB — BASIC METABOLIC PANEL
Anion gap: 9 (ref 5–15)
BUN: 62 mg/dL — AB (ref 6–20)
CHLORIDE: 110 mmol/L (ref 101–111)
CO2: 19 mmol/L — ABNORMAL LOW (ref 22–32)
CREATININE: 4.22 mg/dL — AB (ref 0.44–1.00)
Calcium: 7.5 mg/dL — ABNORMAL LOW (ref 8.9–10.3)
GFR calc Af Amer: 13 mL/min — ABNORMAL LOW (ref >60)
GFR calc non Af Amer: 11 mL/min — ABNORMAL LOW (ref >60)
GLUCOSE: 123 mg/dL — AB (ref 65–99)
POTASSIUM: 4.4 mmol/L (ref 3.5–5.1)
Sodium: 138 mmol/L (ref 135–145)

## 2016-04-22 LAB — CREATININE, URINE, RANDOM: Creatinine, Urine: 71.69 mg/dL

## 2016-04-22 LAB — GLUCOSE, CAPILLARY
GLUCOSE-CAPILLARY: 117 mg/dL — AB (ref 65–99)
GLUCOSE-CAPILLARY: 169 mg/dL — AB (ref 65–99)
Glucose-Capillary: 109 mg/dL — ABNORMAL HIGH (ref 65–99)
Glucose-Capillary: 122 mg/dL — ABNORMAL HIGH (ref 65–99)

## 2016-04-22 LAB — HEPATIC FUNCTION PANEL
ALBUMIN: 2.4 g/dL — AB (ref 3.5–5.0)
ALK PHOS: 66 U/L (ref 38–126)
ALT: 14 U/L (ref 14–54)
AST: 20 U/L (ref 15–41)
BILIRUBIN DIRECT: 0.1 mg/dL (ref 0.1–0.5)
BILIRUBIN TOTAL: 0.4 mg/dL (ref 0.3–1.2)
Indirect Bilirubin: 0.3 mg/dL (ref 0.3–0.9)
Total Protein: 4.9 g/dL — ABNORMAL LOW (ref 6.5–8.1)

## 2016-04-22 LAB — SODIUM, URINE, RANDOM: Sodium, Ur: 73 mmol/L

## 2016-04-22 LAB — PHOSPHORUS: PHOSPHORUS: 5.9 mg/dL — AB (ref 2.5–4.6)

## 2016-04-22 MED ORDER — HYDRALAZINE HCL 50 MG PO TABS
50.0000 mg | ORAL_TABLET | Freq: Three times a day (TID) | ORAL | Status: DC
  Administered 2016-04-22 – 2016-04-25 (×10): 50 mg via ORAL
  Filled 2016-04-22 (×10): qty 1

## 2016-04-22 MED ORDER — SODIUM BICARBONATE 650 MG PO TABS
1300.0000 mg | ORAL_TABLET | Freq: Three times a day (TID) | ORAL | Status: DC
  Administered 2016-04-22 – 2016-04-25 (×10): 1300 mg via ORAL
  Filled 2016-04-22 (×10): qty 2

## 2016-04-22 NOTE — Progress Notes (Signed)
Progress Note    [name]  [id number] DOB: 1966/05/22  DOA: 04/11/2016 PCP: No primary care provider on file.    Brief Narrative:   [name] is an 50 y.o. female  with medical history significant of of type 2 diabetes, hypertension, bilateral BKA, and occasional noncompliance prior to this admission, depression/bipolar disorder with prior suicidal attempt who initially was admitted to [facility] on 04/01/16 for evaluation of chest pain during that visit she also claimed that she had suicidal ideation by overdosing on multiple medications. She was subsequently admitted to [facility], cardiac enzymes remained and negative. Her hospital course was complicated by development of uncontrolled hypertension for which she was treated with multiple antihypertensives and a UTI for which she required intravenous antibiotics. She was followed by tele psychiatry at [facility], initially recommendations were to transfer to an inpatient psychiatric facility, however psychiatry recommended that the patient be transferred to [facility] for face-to-face evaluation by a psychiatrist on 04/11/16.   Assessment/Plan:   Principal Problem:   Chest pain EKG and enzymes were negative at [facility] was admitted [facility] on 7/23 for this primary reason. No further workup planned. She does have advanced chronic kidney disease which limits the ability to do invasive procedures. She has not had any further chest pains. Continue aspirin.   Active Problems:   Hyperkalemia in the setting of stage IV chronic kidney disease Labs checked today and found to be hyperkalemic. Most likely reflective of her stage IV chronic kidney disease as she is not on any medications or supplements that would raise her potassium. 12-lead EKG negative for peaked T waves. Potassium now normalized with high-dose Kayexalate.    Depression with suicidal ideation No further complaints of  suicidal ideation. She does have disposition problems on discharge as well that may be contributing to her stress. We evaluated by psychiatrist on 04/17/16. Did not meet criteria for acute psychiatric hospitalization. Safety sitter discontinued as the patient contracted for safety and did not have an active suicidal plan. Continue Fluoxetine 20 mg and Seroquel 25 mg 3 times a day. Continues to deny suicidal thoughts/intent.    CKD (chronic kidney disease) stage 4, GFR 15-29 ml/min (HCC) complicated by hyperkalemia and metabolic acidosis Baseline creatinine is approximately 3.5 during her stay at [facility], but unclear if this is her typical baseline. Pt says she has never seen a nephrologist. Will need nephrology follow-up at discharge.  Evaluated by nephrologist 04/21/16 with recommendations to start IV fluids cautiously. Creatinine slightly improved today. Continue Kayexalate and also increase sodium bicarbonate supplementation.    HTN (hypertension) Systolic blood pressures sporadically elevated.  Increase hydralazine. Continue metoprolol.      DM type 2 ([place]) with multiple complications Currently being managed with 10 units of Levemir daily and moderate scale SSI 3 times a day. CBGs 100-146.    S/P bilateral BKA (below knee amputation) ([place]) Peripheral neuropathy (HCC)/Morbid obesity ([place]) For SNF placement.   Family Communication/Anticipated D/C date and plan/Code Status   DVT prophylaxis: Subcutaneous heparin ordered. Code Status: Full Code.  Family Communication: No family at the bedsie. Disposition Plan: SNF when bed available.Medically stable.   Medical Consultants:    Psychiatry  Palliative Care  Nephrology   Procedures:    Anti-Infectives:   Anti-infectives    None      Subjective:   [name] report some diarrhea yesterday. Appetite remains fair. Denies pain. No shortness of breath.  Objective:  Vitals:   04/21/16 1330 04/21/16 1450  04/21/16 2107 04/22/16 0531  BP: (!) 180/97 (!) 149/95 136/72 (!) 118/59  Pulse: 61  65 (!) 58  Resp: 16   20  Temp: 98 F (36.7 C)   97.9 F (36.6 C)  TempSrc: Oral   Oral  SpO2: 97%   98%  Weight:      Height:        Intake/Output Summary (Last 24 hours) at 04/22/16 0821 Last data filed at 04/22/16 0600  Gross per 24 hour  Intake              660 ml  Output                0 ml  Net              660 ml   Filed Weights   04/11/16 1520 04/19/16 0450  Weight: (!) 160.6 kg (354 lb) (!) 156.5 kg (345 lb)    Exam: General exam: Appears calm and comfortable. Morbidly obese. Respiratory system: Lungs clear to auscultation bilaterally with diminished. Respiratory effort normal. Cardiovascular system: S1 & S2 heard, RRR. No JVD,  rubs, gallops or clicks. II/VI SEM. Gastrointestinal system: Abdomen is nondistended, soft and nontender. No organomegaly or masses felt. Normal bowel sounds heard. Central nervous system: Alert and oriented. No focal neurological deficits. Extremities: Bilateral BKAs. Skin: No rashes, lesions or ulcers. Psychiatry: Judgement and insight appear diminished. Mood & affect depressed/flat.   Data Reviewed:   I have personally reviewed following labs and imaging studies:  Labs: Basic Metabolic Panel:  Recent Labs Lab 04/20/16 1021 04/20/16 1926 04/21/16 0137 04/21/16 0602 04/22/16 0423  NA 134* 135 135 137 138  K 7.1* 7.1* 6.3* 6.3* 4.4  CL 108 110 110 111 110  CO2 19* 19* 19* 20* 19*  GLUCOSE 135* 126* 98 86 123*  BUN 73* 73* 67* 64* 62*  CREATININE 4.61* 4.66* 4.67* 4.49* 4.22*  CALCIUM 8.5* 8.4* 8.1* 8.2* 7.5*  PHOS  --   --   --   --  5.9*   GFR Estimated Creatinine Clearance: 25.4 mL/min (by C-G formula based on SCr of 4.22 mg/dL). Liver Function Tests:  Recent Labs Lab 04/22/16 0423  AST 20  ALT 14  ALKPHOS 66  BILITOT 0.4  PROT 4.9*  ALBUMIN 2.4*   No results for input(s): LIPASE, AMYLASE in the last 168 hours. No results  for input(s): AMMONIA in the last 168 hours. Coagulation profile No results for input(s): INR, PROTIME in the last 168 hours.  CBC: No results for input(s): WBC, NEUTROABS, HGB, HCT, MCV, PLT in the last 168 hours. CBG:  Recent Labs Lab 04/21/16 0754 04/21/16 1213 04/21/16 1700 04/21/16 2135 04/22/16 0732  GLUCAP 100* 115* 127* 146* 117*   Microbiology No results found for this or any previous visit (from the past 240 hour(s)).  Radiology: No results found.  Medications:   . aspirin EC  81 mg Oral Daily  . FLUoxetine  20 mg Oral Daily  . fluticasone  1 spray Each Nare BID  . heparin  5,000 Units Subcutaneous Q8H  . hydrALAZINE  25 mg Oral Q8H  . insulin aspart  0-15 Units Subcutaneous TID WC  . insulin detemir  10 Units Subcutaneous BID  . loratadine  10 mg Oral Daily  . metoprolol tartrate  25 mg Oral BID  . pantoprazole  40 mg Oral Daily  . pregabalin  25 mg Oral QHS  . QUEtiapine  25 mg Oral TID  . sodium bicarbonate  650 mg Oral TID  . sodium polystyrene  45 g Oral BID   Continuous Infusions: . sodium chloride 75 mL/hr at 04/21/16 2112    Time spent: 25 minutes.   LOS: 11 days   [facility] Pager [phone]. If unable to reach me by pager, please call my cell phone at [phone].  *Please refer to [email or web address], password [email or web address] to get updated schedule on who will round on this patient, as hospitalists switch teams weekly. If 7PM-7AM, please contact night-coverage at [email or web address], password [email or web address] for any overnight needs.  04/22/2016, 8:21 AM

## 2016-04-22 NOTE — Progress Notes (Signed)
Daily Progress Note   Patient Name: [name]       Date: 04/22/2016 DOB: 1966/09/01  Age: 50 y.o. MRN#: [id number] Attending Physician: Venetia Maxon Rama, MD Primary Care Physician: No primary care provider on file. Admit Date: 04/11/2016  Reason for Consultation/Follow-up: Establishing goals of care  Subjective:  "I'm scared", patient is tearful this morning. Denies pain.  Discussed with patient about her renal function, improvement in her K levels.there is no family at the bedside. Offered active listening and supportive care.   Length of Stay: 11  Current Medications: Scheduled Meds:  . aspirin EC  81 mg Oral Daily  . FLUoxetine  20 mg Oral Daily  . fluticasone  1 spray Each Nare BID  . heparin  5,000 Units Subcutaneous Q8H  . hydrALAZINE  50 mg Oral Q8H  . insulin aspart  0-15 Units Subcutaneous TID WC  . insulin detemir  10 Units Subcutaneous BID  . loratadine  10 mg Oral Daily  . metoprolol tartrate  25 mg Oral BID  . pantoprazole  40 mg Oral Daily  . pregabalin  25 mg Oral QHS  . QUEtiapine  25 mg Oral TID  . sodium bicarbonate  1,300 mg Oral TID  . sodium polystyrene  45 g Oral BID    Continuous Infusions: . sodium chloride 75 mL/hr at 04/21/16 2112    PRN Meds: acetaminophen **OR** acetaminophen, albuterol, guaiFENesin-dextromethorphan, ondansetron  Physical Exam         Awake alert S1 S2 Abdomen soft Bilateral edema UE S/p bilateral BKA Tearful  Vital Signs: BP (!) 118/59 (BP Location: Left Arm)   Pulse (!) 58   Temp 97.9 F (36.6 C) (Oral)   Resp 20   Ht 5\' 7"  (1.702 m) Comment: Below the kneww amputation  Wt (!) 156.5 kg (345 lb)   LMP 04/11/2008 (Approximate)   SpO2 98%   BMI 54.03 kg/m  SpO2: SpO2: 98 % O2 Device: O2 Device: Not  Delivered O2 Flow Rate: O2 Flow Rate (L/min): 2 L/min  Intake/output summary:  Intake/Output Summary (Last 24 hours) at 04/22/16 1013 Last data filed at 04/22/16 0600  Gross per 24 hour  Intake              660 ml  Output  0 ml  Net              660 ml   LBM: Last BM Date: 04/21/16 Baseline Weight: Weight: (!) 160.6 kg (354 lb) Most recent weight: Weight: (!) 156.5 kg (345 lb)       Palliative Assessment/Data:    Flowsheet Rows   Flowsheet Row Most Recent Value  Intake Tab  Referral Department  Hospitalist  Unit at Time of Referral  Cardiac/Telemetry Unit  Palliative Care Primary Diagnosis  Nephrology  Palliative Care Type  Return patient Palliative Care  Reason for referral  Clarify Goals of Care  Date first seen by Palliative Care  04/21/16  Clinical Assessment  Palliative Performance Scale Score  30%  Pain Max last 24 hours  4  Pain Min Last 24 hours  3  Dyspnea Max Last 24 Hours  4  Dyspnea Min Last 24 hours  3  Nausea Max Last 24 Hours  4  Nausea Min Last 24 Hours  3  Psychosocial & Spiritual Assessment  Palliative Care Outcomes  Patient/Family meeting held?  Yes  Who was at the meeting?  patient   Palliative Care Outcomes  Clarified goals of care      Patient Active Problem List   Diagnosis Date Noted  . Encounter for palliative care   . Goals of care, counseling/discussion   . Hyperkalemia 04/20/2016  . Metabolic acidosis [id number]  . Chest pain 04/11/2016  . Depression 04/11/2016  . CKD (chronic kidney disease) stage 4, GFR 15-29 ml/min (HCC) 04/11/2016  . HTN (hypertension) 04/11/2016  . DM coma, type 2 ([place]) 04/11/2016  . S/P bilateral BKA (below knee amputation) ([place]) 04/11/2016  . Peripheral neuropathy ([place]) 04/11/2016  . Morbid obesity ([place]) 04/11/2016    Palliative Care Assessment & Plan   Patient Profile:    Assessment:  Stage IV CKD Hyperkalemia HTN Bilateral BKA history Recent psych eval for depression, possible  suicidal ideation Adjustment disorder   Recommendations/Plan:   continue current treatment, nephrology consult noted, appreciate their recommendations. SNF on d/c.  Goals of Care and Additional Recommendations:  Limitations on Scope of Treatment: Full Scope Treatment  Code Status:    Code Status Orders        Start     Ordered   04/11/16 1650  Full code  Continuous     04/11/16 1649    Code Status History    Date Active Date Inactive Code Status Order ID Comments User Context   This patient has a current code status but no historical code status.       Prognosis:   Unable to determine  Discharge Planning:  To Be Determined  Care plan was discussed with  Patient   Thank you for allowing the Palliative Medicine Team to assist in the care of this patient.   Time In:  9 Time Out: 925 Total Time 25 Prolonged Time Billed  no       Greater than 50%  of this time was spent counseling and coordinating care related to the above assessment and plan.  Loistine Chance, MD [phone]  Please contact Palliative Medicine Team phone at [phone] for questions and concerns.  Password [email or web address]

## 2016-04-23 LAB — RENAL FUNCTION PANEL
ALBUMIN: 2.7 g/dL — AB (ref 3.5–5.0)
Anion gap: 11 (ref 5–15)
BUN: 57 mg/dL — AB (ref 6–20)
CO2: 20 mmol/L — ABNORMAL LOW (ref 22–32)
CREATININE: 4.1 mg/dL — AB (ref 0.44–1.00)
Calcium: 7.2 mg/dL — ABNORMAL LOW (ref 8.9–10.3)
Chloride: 109 mmol/L (ref 101–111)
GFR calc Af Amer: 14 mL/min — ABNORMAL LOW (ref >60)
GFR, EST NON AFRICAN AMERICAN: 12 mL/min — AB (ref >60)
GLUCOSE: 82 mg/dL (ref 65–99)
PHOSPHORUS: 6.4 mg/dL — AB (ref 2.5–4.6)
POTASSIUM: 3.1 mmol/L — AB (ref 3.5–5.1)
Sodium: 140 mmol/L (ref 135–145)

## 2016-04-23 LAB — GLUCOSE, CAPILLARY
GLUCOSE-CAPILLARY: 93 mg/dL (ref 65–99)
GLUCOSE-CAPILLARY: 89 mg/dL (ref 65–99)
GLUCOSE-CAPILLARY: 149 mg/dL — AB (ref 65–99)
Glucose-Capillary: 153 mg/dL — ABNORMAL HIGH (ref 65–99)

## 2016-04-23 NOTE — Progress Notes (Signed)
CSW left voicemail for [name] at [facility], awaiting call back re: bed availability/offer.    Raynaldo Opitz, [facility] Clinical Social Worker cell #: [phone]

## 2016-04-23 NOTE — Progress Notes (Signed)
Progress Note    [name]  [id number] DOB: 1966/07/15  DOA: 04/11/2016 PCP: No primary care provider on file.    Brief Narrative:   [name] is an 50 y.o. female  with medical history significant of of type 2 diabetes, hypertension, bilateral BKA, and occasional noncompliance prior to this admission, depression/bipolar disorder with prior suicidal attempt who initially was admitted to [facility] on 04/01/16 for evaluation of chest pain during that visit she also claimed that she had suicidal ideation by overdosing on multiple medications. She was subsequently admitted to [facility], cardiac enzymes remained and negative. Her hospital course was complicated by development of uncontrolled hypertension for which she was treated with multiple antihypertensives and a UTI for which she required intravenous antibiotics. She was followed by tele psychiatry at [facility], initially recommendations were to transfer to an inpatient psychiatric facility, however psychiatry recommended that the patient be transferred to [facility] for face-to-face evaluation by a psychiatrist on 04/11/16.   Assessment/Plan:   Principal Problem:   Chest pain EKG and enzymes were negative at [facility] was admitted [facility] on 7/23 for this primary reason. No further workup planned. She does have advanced chronic kidney disease which limits the ability to do invasive procedures. She has not had any further chest pains. Continue aspirin.   Active Problems:   Hyperkalemia in the setting of stage IV chronic kidney disease Labs checked today and found to be hyperkalemic. Most likely reflective of her stage IV chronic kidney disease as she is not on any medications or supplements that would raise her potassium. 12-lead EKG negative for peaked T waves. Potassium now normalized with high-dose Kayexalate.    Depression with suicidal ideation No further complaints of  suicidal ideation. She does have disposition problems on discharge as well that may be contributing to her stress. We evaluated by psychiatrist on 04/17/16. Did not meet criteria for acute psychiatric hospitalization. Safety sitter discontinued as the patient contracted for safety and did not have an active suicidal plan. Continue Fluoxetine 20 mg and Seroquel 25 mg 3 times a day. Continues to deny suicidal thoughts/intent.    CKD (chronic kidney disease) stage 4, GFR 15-29 ml/min (HCC) complicated by hyperkalemia and metabolic acidosis Baseline creatinine is approximately 3.5 during her stay at [facility], but unclear if this is her typical baseline. Pt says she has never seen a nephrologist. Will need nephrology follow-up at discharge.  Evaluated by nephrologist 04/21/16 with recommendations to start IV fluids cautiously. Creatinine improved, discontinue IV fluids. Continue sodium bicarbonate. Kayexalate discontinued.    HTN (hypertension) Systolic blood pressures sporadically elevated.  Increase hydralazine. Continue metoprolol.      DM type 2 ([place]) with multiple complications Currently being managed with 10 units of Levemir daily and moderate scale SSI 3 times a day. CBGs 93-169.    S/P bilateral BKA (below knee amputation) ([place]) Peripheral neuropathy (HCC)/Morbid obesity ([place]) For SNF placement.   Family Communication/Anticipated D/C date and plan/Code Status   DVT prophylaxis: Subcutaneous heparin ordered. Code Status: Full Code.  Family Communication: No family at the bedsie. Disposition Plan: SNF when bed available.Medically stable.   Medical Consultants:    Psychiatry  Palliative Care  Nephrology   Procedures:    Anti-Infectives:   Anti-infectives    None      Subjective:   [name] denies pain, shortness of breath, nausea, or diminished appetite.  Objective:    Vitals:   04/22/16 0531  04/22/16 1320 04/22/16 2135 04/23/16 0427  BP: (!) 118/59  (!) 149/91 (!) 152/72 (!) 126/57  Pulse: (!) 58 (!) 57 62 (!) 58  Resp: 20 18 18 16   Temp: 97.9 F (36.6 C) 98.1 F (36.7 C) 98.1 F (36.7 C) 98.6 F (37 C)  TempSrc: Oral Oral Oral Oral  SpO2: 98% 95% 97% 100%  Weight:      Height:        Intake/Output Summary (Last 24 hours) at 04/23/16 0924 Last data filed at 04/23/16 0428  Gross per 24 hour  Intake             2575 ml  Output              350 ml  Net             2225 ml   Filed Weights   04/11/16 1520 04/19/16 0450  Weight: (!) 160.6 kg (354 lb) (!) 156.5 kg (345 lb)    Exam: General exam: Appears calm and comfortable. Morbidly obese. Respiratory system: Lungs clear to auscultation bilaterally with diminished. Respiratory effort normal. Cardiovascular system: S1 & S2 heard, RRR. No JVD,  rubs, gallops or clicks. II/VI SEM. Gastrointestinal system: Abdomen is nondistended, soft and nontender. No organomegaly or masses felt. Normal bowel sounds heard. Central nervous system: Alert and oriented. No focal neurological deficits. Extremities: Bilateral BKAs.Bilateral upper extremity swelling/anasarca. Skin: No rashes, lesions or ulcers. Psychiatry: Judgement and insight appear diminished. Mood & affect depressed/flat.   Data Reviewed:   I have personally reviewed following labs and imaging studies:  Labs: Basic Metabolic Panel:  Recent Labs Lab 04/20/16 1926 04/21/16 0137 04/21/16 0602 04/22/16 0423 04/23/16 0452  NA 135 135 137 138 140  K 7.1* 6.3* 6.3* 4.4 3.1*  CL 110 110 111 110 109  CO2 19* 19* 20* 19* 20*  GLUCOSE 126* 98 86 123* 82  BUN 73* 67* 64* 62* 57*  CREATININE 4.66* 4.67* 4.49* 4.22* 4.10*  CALCIUM 8.4* 8.1* 8.2* 7.5* 7.2*  PHOS  --   --   --  5.9* 6.4*   GFR Estimated Creatinine Clearance: 26.1 mL/min (by C-G formula based on SCr of 4.1 mg/dL). Liver Function Tests:  Recent Labs Lab 04/22/16 0423 04/23/16 0452  AST 20  --   ALT 14  --   ALKPHOS 66  --   BILITOT 0.4  --   PROT 4.9*   --   ALBUMIN 2.4* 2.7*   No results for input(s): LIPASE, AMYLASE in the last 168 hours. No results for input(s): AMMONIA in the last 168 hours. Coagulation profile No results for input(s): INR, PROTIME in the last 168 hours.  CBC: No results for input(s): WBC, NEUTROABS, HGB, HCT, MCV, PLT in the last 168 hours. CBG:  Recent Labs Lab 04/22/16 0732 04/22/16 1136 04/22/16 1744 04/22/16 2132 04/23/16 0732  GLUCAP 117* 169* 109* 122* 93   Microbiology No results found for this or any previous visit (from the past 240 hour(s)).  Radiology: US Renal  Result Date: 04/22/2016 CLINICAL DATA:  Chronic kidney disease. EXAM: RENAL / URINARY TRACT ULTRASOUND COMPLETE COMPARISON:  CT, 10/30/2010 FINDINGS: Right Kidney: Length: 11.1 cm. Echogenicity within normal limits. No mass or hydronephrosis visualized. Left Kidney: Length: 12.6 cm. Echogenicity within normal limits. No mass or hydronephrosis visualized. Bladder: Appears normal for degree of bladder distention. IMPRESSION: Normal renal ultrasound.  No hydronephrosis. Electronically Signed   By: Lajean Manes M.D.   On: 04/22/2016 09:00  Medications:   . aspirin EC  81 mg Oral Daily  . FLUoxetine  20 mg Oral Daily  . fluticasone  1 spray Each Nare BID  . heparin  5,000 Units Subcutaneous Q8H  . hydrALAZINE  50 mg Oral Q8H  . insulin aspart  0-15 Units Subcutaneous TID WC  . insulin detemir  10 Units Subcutaneous BID  . loratadine  10 mg Oral Daily  . metoprolol tartrate  25 mg Oral BID  . pantoprazole  40 mg Oral Daily  . pregabalin  25 mg Oral QHS  . QUEtiapine  25 mg Oral TID  . sodium bicarbonate  1,300 mg Oral TID   Continuous Infusions: . sodium chloride 75 mL/hr at 04/22/16 2321    Time spent: 25 minutes.   LOS: 12 days   [place] [facility] Pager [phone]. If unable to reach me by pager, please call my cell phone at [phone].  *Please refer to [email or web address], password [email or web address] to get updated schedule on  who will round on this patient, as hospitalists switch teams weekly. If 7PM-7AM, please contact night-coverage at [email or web address], password [email or web address] for any overnight needs.  04/23/2016, 9:24 AM

## 2016-04-23 NOTE — Progress Notes (Signed)
Physical Therapy Treatment Patient Details Name: [name] MRN: [id number] DOB: 06-25-1966 Today's Date: 04/23/2016    History of Present Illness Pt is admitted here for face to face meeting with psychiatrist medical history significant of of type 2 diabetes, hypertension, bilateral BKA, and occasional noncompliance prior to this admission, depression/bipolar disorder with prior suicidal attempt     PT Comments    Pt showing progress with sit > supine.  Worked on scooting while at [facility], but has difficulty with weight shifting and repositioning. Con't to recommend SNF.  Follow Up Recommendations  SNF     Equipment Recommendations  None recommended by PT    Recommendations for Other Services       Precautions / Restrictions Precautions Precautions: Fall Precaution Comments: Hx B BKA Restrictions Weight Bearing Restrictions: Yes Other Position/Activity Restrictions: non amb uses a power scooter for mobility    Mobility  Bed Mobility Overal bed mobility: Needs Assistance Bed Mobility: Rolling;Sidelying to Sit;Sit to Supine Rolling: Mod assist;+2 for safety/equipment Sidelying to sit: Mod assist;+2 for physical assistance   Sit to supine: Min assist   General bed mobility comments: Bed deflated for mobility. At E[facility] attempted scooting to the R with use of UE and weight shifting hips. Pt had difficult time with it and actually was actually moving more forward and to the L.  Transfers                    Ambulation/Gait                 Stairs            Wheelchair Mobility    Modified Rankin (Stroke Patients Only)       Balance                                    Cognition Arousal/Alertness: Awake/alert Behavior During Therapy: WFL for tasks assessed/performed Overall Cognitive Status: Within Functional Limits for tasks assessed                      Exercises      General Comments        Pertinent  Vitals/Pain Pain Assessment: No/denies pain    Home Living                      Prior Function            PT Goals (current goals can now be found in the care plan section) Acute Rehab PT Goals PT Goal Formulation: With patient Time For Goal Achievement: 04/28/16 Potential to Achieve Goals: Good Progress towards PT goals: Progressing toward goals    Frequency  Min 2X/week    PT Plan Current plan remains appropriate    Co-evaluation             End of Session   Activity Tolerance: Patient tolerated treatment well Patient left: in bed     Time: [id number] PT Time Calculation (min) (ACUTE ONLY): 22 min  Charges:  $Therapeutic Activity: 8-22 mins                    G Codes:      [name] 04/23/2016, 11:39 AM

## 2016-04-23 NOTE — Progress Notes (Signed)
  [facility] Progress Note   Subjective: no UOP recorded. Creat slightly lower.    Vitals:   04/21/16 2107 04/22/16 0531 04/22/16 1320 04/22/16 2135  BP: 136/72 (!) 118/59 (!) 149/91 (!) 152/72  Pulse: 65 (!) 58 (!) 57 62  Resp:  '20 18 18  '$ Temp:  97.9 F (36.6 C) 98.1 F (36.7 C) 98.1 F (36.7 C)  TempSrc:  Oral Oral Oral  SpO2:  98% 95% 97%  Weight:      Height:        Inpatient medications: . aspirin EC  81 mg Oral Daily  . FLUoxetine  20 mg Oral Daily  . fluticasone  1 spray Each Nare BID  . heparin  5,000 Units Subcutaneous Q8H  . hydrALAZINE  50 mg Oral Q8H  . insulin aspart  0-15 Units Subcutaneous TID WC  . insulin detemir  10 Units Subcutaneous BID  . loratadine  10 mg Oral Daily  . metoprolol tartrate  25 mg Oral BID  . pantoprazole  40 mg Oral Daily  . pregabalin  25 mg Oral QHS  . QUEtiapine  25 mg Oral TID  . sodium bicarbonate  1,300 mg Oral TID  . sodium polystyrene  45 g Oral BID   . sodium chloride 75 mL/hr at 04/22/16 2321   acetaminophen **OR** acetaminophen, albuterol, guaiFENesin-dextromethorphan, ondansetron  Exam: Gen morbid obesity, alert, pleasant No rash, cyanosis or gangrene Sclera anicteric, throat clear  No jvd or bruits Chest clear bilat RRR no MRG  Abd soft ntnd no mass or ascites +bs marked obesity GU defer MS no joint effusions or deformity, bilat BKA well healed Ext no sig LE or UE edema / no wounds or ulcers Neuro is alert, Ox 3 , nf  Na 137  K 6.3  BUN 64  Cr 4.49   CO2 20   Ca 8.2  eGFR 12   Assessment: 1  CKD 4  - possible component of vol depletion w Cr bump and hyperkalemia.  Trying IVF's.  2  Hyperkalemia - increase kayexalate, cont renal diet 3  HTN - on MTP and hydralazine 4  Volume - no edema on exam 5  Morbid obesity 6  GOC - being seen by pall care team 7  Depression / anxiety 8  DM2  Plan - renal diet, cautious IVF"s   Kelly Splinter MD [facility] pager [phone]     cell [phone] 04/23/2016, 12:23 AM    Recent Labs Lab 04/21/16 0137 04/21/16 0602 04/22/16 0423  NA 135 137 138  K 6.3* 6.3* 4.4  CL 110 111 110  CO2 19* 20* 19*  GLUCOSE 98 86 123*  BUN 67* 64* 62*  CREATININE 4.67* 4.49* 4.22*  CALCIUM 8.1* 8.2* 7.5*  PHOS  --   --  5.9*    Recent Labs Lab 04/22/16 0423  AST 20  ALT 14  ALKPHOS 66  BILITOT 0.4  PROT 4.9*  ALBUMIN 2.4*   No results for input(s): WBC, NEUTROABS, HGB, HCT, MCV, PLT in the last 168 hours. Iron/TIBC/Ferritin/ %Sat No results found for: IRON, TIBC, FERRITIN, IRONPCTSAT

## 2016-04-23 NOTE — Progress Notes (Signed)
  [name] Progress Note   Subjective:  K WNL Improving SCr Tol PO well BP ok Off kayexalate On NaHCO3 PO  Vitals:   04/22/16 0531 04/22/16 1320 04/22/16 2135 04/23/16 0427  BP: (!) 118/59 (!) 149/91 (!) 152/72 (!) 126/57  Pulse: (!) 58 (!) 57 62 (!) 58  Resp: '20 18 18 16  '$ Temp: 97.9 F (36.6 C) 98.1 F (36.7 C) 98.1 F (36.7 C) 98.6 F (37 C)  TempSrc: Oral Oral Oral Oral  SpO2: 98% 95% 97% 100%  Weight:      Height:        Inpatient medications: . aspirin EC  81 mg Oral Daily  . FLUoxetine  20 mg Oral Daily  . fluticasone  1 spray Each Nare BID  . heparin  5,000 Units Subcutaneous Q8H  . hydrALAZINE  50 mg Oral Q8H  . insulin aspart  0-15 Units Subcutaneous TID WC  . insulin detemir  10 Units Subcutaneous BID  . loratadine  10 mg Oral Daily  . metoprolol tartrate  25 mg Oral BID  . pantoprazole  40 mg Oral Daily  . pregabalin  25 mg Oral QHS  . QUEtiapine  25 mg Oral TID  . sodium bicarbonate  1,300 mg Oral TID     acetaminophen **OR** acetaminophen, albuterol, guaiFENesin-dextromethorphan, ondansetron  Exam: Gen morbid obesity, alert, pleasant No rash, cyanosis or gangrene Sclera anicteric, throat clear  No jvd or bruits Chest clear bilat RRR no MRG  Abd soft ntnd no mass or ascites +bs marked obesity GU defer MS no joint effusions or deformity, bilat BKA well healed Ext no sig LE or UE edema / no wounds or ulcers Neuro is alert, Ox 3 , nf  Na 137  K 6.3  BUN 64  Cr 4.49   CO2 20   Ca 8.2  eGFR 12   Assessment: 1  CKD 4  - possible component of vol depletion w Cr bump and hyperkalemia.  Improved with IVFs 2  Hyperkalemia - s/p kayexalate, cont renal diet 3  HTN - on MTP and hydralazine 4  Volume - no edema on exam 5  Morbid obesity 6  GOC - being seen by pall care team 7  Depression / anxiety 8  DM2  Plan - Stop IVFs, remove foley.  Cont NaHCO3.  Will need close outpt f/u and discussions about RRT.     Pearson Grippe  MD 04/23/2016, 12:19 PM    Recent Labs Lab 04/21/16 0602 04/22/16 0423 04/23/16 0452  NA 137 138 140  K 6.3* 4.4 3.1*  CL 111 110 109  CO2 20* 19* 20*  GLUCOSE 86 123* 82  BUN 64* 62* 57*  CREATININE 4.49* 4.22* 4.10*  CALCIUM 8.2* 7.5* 7.2*  PHOS  --  5.9* 6.4*    Recent Labs Lab 04/22/16 0423 04/23/16 0452  AST 20  --   ALT 14  --   ALKPHOS 66  --   BILITOT 0.4  --   PROT 4.9*  --   ALBUMIN 2.4* 2.7*   No results for input(s): WBC, NEUTROABS, HGB, HCT, MCV, PLT in the last 168 hours. Iron/TIBC/Ferritin/ %Sat No results found for: IRON, TIBC, FERRITIN, IRONPCTSAT

## 2016-04-23 NOTE — Progress Notes (Signed)
Nutrition Brief Note  Patient identified for LOS (day #12).  Wt Readings from Last 15 Encounters:  04/19/16 (!) 345 lb (156.5 kg)    Body mass index is 54.03 kg/m. Patient meets criteria for morbid obesity based on current BMI. Pt with stage 4 CKD, nephrology and Palliative Care following at this time. Pt with bilateral BKAs; skin otherwise WDL.  Current diet order is Renal/Carb Modified, patient consuming 50-100% of meals since admission. Labs and medications reviewed.   No nutrition interventions warranted at this time. If nutrition issues arise, please consult RD.     Jarome Matin, MS, RD, LDN Inpatient Clinical Dietitian Pager # [phone] After hours/weekend pager # [phone]

## 2016-04-24 LAB — BASIC METABOLIC PANEL
ANION GAP: 10 (ref 5–15)
BUN: 58 mg/dL — ABNORMAL HIGH (ref 6–20)
CO2: 21 mmol/L — AB (ref 22–32)
Calcium: 7.1 mg/dL — ABNORMAL LOW (ref 8.9–10.3)
Chloride: 106 mmol/L (ref 101–111)
Creatinine, Ser: 4 mg/dL — ABNORMAL HIGH (ref 0.44–1.00)
GFR calc Af Amer: 14 mL/min — ABNORMAL LOW (ref >60)
GFR calc non Af Amer: 12 mL/min — ABNORMAL LOW (ref >60)
GLUCOSE: 103 mg/dL — AB (ref 65–99)
POTASSIUM: 2.9 mmol/L — AB (ref 3.5–5.1)
Sodium: 137 mmol/L (ref 135–145)

## 2016-04-24 LAB — GLUCOSE, CAPILLARY
GLUCOSE-CAPILLARY: 131 mg/dL — AB (ref 65–99)
GLUCOSE-CAPILLARY: 124 mg/dL — AB (ref 65–99)
Glucose-Capillary: 92 mg/dL (ref 65–99)
Glucose-Capillary: 227 mg/dL — ABNORMAL HIGH (ref 65–99)

## 2016-04-24 MED ORDER — POTASSIUM CHLORIDE CRYS ER 20 MEQ PO TBCR
40.0000 meq | EXTENDED_RELEASE_TABLET | Freq: Once | ORAL | Status: AC
  Administered 2016-04-24: 40 meq via ORAL
  Filled 2016-04-24: qty 2

## 2016-04-24 NOTE — Progress Notes (Signed)
Patient denied bed at [place].

## 2016-04-24 NOTE — Progress Notes (Signed)
Progress Note    [name]  [id number] DOB: [birth year]  DOA: 04/11/2016 PCP: No primary care provider on file.    Brief Narrative:   [name] is an 50 y.o. female  with medical history significant of of type 2 diabetes, hypertension, bilateral BKA, and occasional noncompliance prior to this admission, depression/bipolar disorder with prior suicidal attempt who initially was admitted to [facility] on 04/01/16 for evaluation of chest pain during that visit she also claimed that she had suicidal ideation by overdosing on multiple medications. She was subsequently admitted to [facility], cardiac enzymes remained and negative. Her hospital course was complicated by development of uncontrolled hypertension for which she was treated with multiple antihypertensives and a UTI for which she required intravenous antibiotics. She was followed by tele psychiatry at [facility], initially recommendations were to transfer to an inpatient psychiatric facility, however psychiatry recommended that the patient be transferred to [facility] for face-to-face evaluation by a psychiatrist on 04/11/16.   Assessment/Plan:   Principal Problem:   Chest pain EKG and enzymes were negative at [facility] was admitted [facility] on 7/23 for this primary reason. No further workup planned. She does have advanced chronic kidney disease which limits the ability to do invasive procedures. She has not had any further chest pains. Continue aspirin.   Active Problems:   Hyperkalemia in the setting of stage IV chronic kidney disease Labs checked today and found to be hyperkalemic. Most likely reflective of her stage IV chronic kidney disease as she is not on any medications or supplements that would raise her potassium. 12-lead EKG negative for peaked T waves. Potassium corrected status post Kayexalate, but now on the low side. Will allow patient to eat foods rich in potassium  today and recheck in the morning. Prefer to avoid supplements if possible, as the patient enjoys sweet potatoes, etc.    Depression with suicidal ideation No further complaints of suicidal ideation. She does have disposition problems on discharge as well that may be contributing to her stress. We evaluated by psychiatrist on 04/17/16. Did not meet criteria for acute psychiatric hospitalization. Safety sitter discontinued as the patient contracted for safety and did not have an active suicidal plan. Continue Fluoxetine 20 mg and Seroquel 25 mg 3 times a day. Continues to deny suicidal thoughts/intent.    CKD (chronic kidney disease) stage 4, GFR 15-29 ml/min (HCC) complicated by hyperkalemia and metabolic acidosis Baseline creatinine is approximately 3.5 during her stay at [facility], but unclear if this is her typical baseline. Pt says she has never seen a nephrologist. Will need nephrology follow-up at discharge.  Evaluated by nephrologist 04/21/16 with recommendations to start IV fluids cautiously. Creatinine improved, discontinue IV fluids. Continue sodium bicarbonate. Kayexalate discontinued.    HTN (hypertension) Systolic blood pressures sporadically elevated.  Increase hydralazine. Continue metoprolol.      DM type 2 ([place]) with multiple complications Currently being managed with 10 units of Levemir daily and moderate scale SSI 3 times a day. CBGs 93-169.    S/P bilateral BKA (below knee amputation) ([place]) Peripheral neuropathy (HCC)/Morbid obesity ([place]) For SNF placement.   Family Communication/Anticipated D/C date and plan/Code Status   DVT prophylaxis: Subcutaneous heparin ordered. Code Status: Full Code.  Family Communication: No family at the bedsie. Disposition Plan: SNF when bed available. Medically stable and awaiting bed offers.   Medical Consultants:    Psychiatry  Palliative Care  Nephrology   Procedures:  Anti-Infectives:   Anti-infectives    None        Subjective:   [name] denies pain, shortness of breath, nausea, or diminished appetite.Affect seems a bit brighter today.  Objective:    Vitals:   04/23/16 1334 04/23/16 1429 04/23/16 2057 04/24/16 0656  BP: (!) 155/79 130/60 (!) 127/57 (!) 161/69  Pulse: (!) 54 (!) 52 (!) 59 61  Resp:  18 18 18   Temp:  97.7 F (36.5 C) 98.3 F (36.8 C) 98.2 F (36.8 C)  TempSrc:  Oral Oral Oral  SpO2: 95% 100% 98% 92%  Weight:      Height:        Intake/Output Summary (Last 24 hours) at 04/24/16 0903 Last data filed at 04/23/16 2240  Gross per 24 hour  Intake              240 ml  Output              300 ml  Net              -60 ml   Filed Weights   04/11/16 1520 04/19/16 0450  Weight: (!) 160.6 kg (354 lb) (!) 156.5 kg (345 lb)    Exam: General exam: Appears calm and comfortable. Morbidly obese. Respiratory system: Lungs clear to auscultation bilaterally with diminished. Respiratory effort normal. Cardiovascular system: S1 & S2 heard, RRR. No JVD,  rubs, gallops or clicks. II/VI SEM. Gastrointestinal system: Abdomen is nondistended, soft and nontender. No organomegaly or masses felt. Normal bowel sounds heard. Central nervous system: Alert and oriented. No focal neurological deficits. Extremities: Bilateral BKAs.Bilateral upper extremity swelling/anasarca. Skin: No rashes, lesions or ulcers. Psychiatry: Judgement and insight appear diminished. Mood & affect depressed but brighter today.   Data Reviewed:   I have personally reviewed following labs and imaging studies:  Labs: Basic Metabolic Panel:  Recent Labs Lab 04/21/16 0137 04/21/16 0602 04/22/16 0423 04/23/16 0452 04/24/16 0546  NA 135 137 138 140 137  K 6.3* 6.3* 4.4 3.1* 2.9*  CL 110 111 110 109 106  CO2 19* 20* 19* 20* 21*  GLUCOSE 98 86 123* 82 103*  BUN 67* 64* 62* 57* 58*  CREATININE 4.67* 4.49* 4.22* 4.10* 4.00*  CALCIUM 8.1* 8.2* 7.5* 7.2* 7.1*  PHOS  --   --  5.9* 6.4*  --     GFR Estimated Creatinine Clearance: 26.8 mL/min (by C-G formula based on SCr of 4 mg/dL). Liver Function Tests:  Recent Labs Lab 04/22/16 0423 04/23/16 0452  AST 20  --   ALT 14  --   ALKPHOS 66  --   BILITOT 0.4  --   PROT 4.9*  --   ALBUMIN 2.4* 2.7*   CBG:  Recent Labs Lab 04/23/16 0732 04/23/16 1130 04/23/16 1628 04/23/16 2211 04/24/16 0743  GLUCAP 93 153* 89 149* 92   Microbiology No results found for this or any previous visit (from the past 240 hour(s)).  Radiology: No results found.  Medications:   . aspirin EC  81 mg Oral Daily  . FLUoxetine  20 mg Oral Daily  . fluticasone  1 spray Each Nare BID  . heparin  5,000 Units Subcutaneous Q8H  . hydrALAZINE  50 mg Oral Q8H  . insulin aspart  0-15 Units Subcutaneous TID WC  . insulin detemir  10 Units Subcutaneous BID  . loratadine  10 mg Oral Daily  . metoprolol tartrate  25 mg Oral BID  . pantoprazole  40 mg  Oral Daily  . pregabalin  25 mg Oral QHS  . QUEtiapine  25 mg Oral TID  . sodium bicarbonate  1,300 mg Oral TID   Continuous Infusions:    Time spent: 25 minutes.   LOS: 13 days   [place] [facility] Pager [phone]. If unable to reach me by pager, please call my cell phone at [phone].  *Please refer to [email or web address], password [email or web address] to get updated schedule on who will round on this patient, as hospitalists switch teams weekly. If 7PM-7AM, please contact night-coverage at [email or web address], password [email or web address] for any overnight needs.  04/24/2016, 9:03 AM

## 2016-04-24 NOTE — Progress Notes (Addendum)
CSW attempted to contact [facility] for bed offers:   [facility] [phone]: number has been disconnected   [facility] [phone]: wrong number [facility] [phone]: voicemail let with admissions office  [facility] [phone]: CSW spoke with admissions coordinator and faxed information to facility. Waiting to here if they are able to give bed offer. Fax #: [phone]  Kingsley Spittle, [place] Clinical Social Worker [phone]

## 2016-04-24 NOTE — Consult Note (Signed)
[facility] Psychiatry Consult   Reason for Consult:  Depression and suicide ideation (passive) Referring Physician:  Dr. Wynetta Emery Patient Identification: [name] MRN:  [id number] Principal Diagnosis: Chest pain Diagnosis:   Patient Active Problem List   Diagnosis Date Noted  . Encounter for palliative care [Z51.5]   . Goals of care, counseling/discussion [Z71.89]   . Hyperkalemia [E87.5] 04/20/2016  . Metabolic acidosis [E75.1] 04/20/2016  . Chest pain [R07.9] 04/11/2016  . Depression [F32.9] 04/11/2016  . CKD (chronic kidney disease) stage 4, GFR 15-29 ml/min (HCC) [N18.4] 04/11/2016  . HTN (hypertension) [I10] 04/11/2016  . DM coma, type 2 ([place]) [E11.69] 04/11/2016  . S/P bilateral BKA (below knee amputation) ([place]) [Z00.174, Z89.511] 04/11/2016  . Peripheral neuropathy ([place]) [G62.9] 04/11/2016  . Morbid obesity ([place]) [E66.01] 04/11/2016    Total Time spent with patient: 1 hour  Subjective:   [name] is a 50 y.o. female patient admitted with depression and passive suicide ideation.  HPI:  [name] is a 50 y.o. female with medical history significant of of type 2 diabetes, hypertension, bilateral BKA, and occasional noncompliance prior to this admission, depression/bipolar disorder with prior suicidal attempt who initially was admitted to [facility] on 04/01/16 for evaluation of chest pain. Patient seen face-to-face for this psychiatric consultation and evaluation of increased symptoms of depression and suicidal ideation previously has expressed suicidal plan of intentional overdose of medications. Patient reported she cannot tell how she is going to harm herself today. Patient stated at the same time, "she does not want to die and refused hospice care". Patient endorses depression most situational. Patient has similar symptoms of depression and suicidal ideation 2 years ago before being placed in a skilled nursing facility in [place], [place]  from the [facility] emergency department after her boyfriend and boyfriend's mother left her. Patient is also refusing to go to long-term skilled nursing facility saying that she has been in several facilities in the past and willing to participate short-term skilled nursing facility if possible until she finds a stable place with her daughter, daughter's boyfriend and 2 young grand children. Patient is also reported she wishes to find a boyfriend and has no current relationship since 2 years ago her boyfriend left her. Patient reported her main stress is lack of place to live since her daughter moved out of the house because of financial difficulties. Patient reportedly has a disability income but cannot afford her own house because she needed to pay for her bed and also utilities. Patient denies alcohol abuse and also illicit substance abuse.  She was initially admitted to [facility] for chest pain, cardiac enzymes remained and negative. Cipro course was complicated by development of uncontrolled hypertension for which she was treated with multiple antihypertensives and a UTI for which she required intravenous antibiotics. She was followed by telemetry psychiatry at [facility], initially recommendations were to transfer to an inpatient psychiatric facility, however today psychiatry recommended that the patient be transferred to [facility] for face-to-face evaluation by a psychiatrist.   During my evaluation patient endorses ongoing symptoms of depression and passive suicidal ideation without intention or plan to commit suicide.Patient also reportedly has no previous acute psychiatric hospitalizations her suicidal attempts.  She also acknowledges that she was living with her daughter for the past 1 year in [place], [place], before that she was in a skilled nursing facility in [place], [place]. She unfortunately has no place to go upon discharge has  not of her family members will  be able to take her in. Based on my evaluation patient has depression which is situational and related to lack of support from the family, financial difficulties and place to live.  8/82017 Interval history: Patient seen today along with [name] for the follow-up psychiatric consultation and evaluation. Patient appeared in her bed, wide awake, alert, oriented to time, place and person. She has been watching television show and says she likes to watch that story and seems to enjoying. She has spoken with her aunt on the phone which went well without incident and has no contact with her daughter. She has been feeling much better as she is able to sleep good last night. She is calm and cooperative with hospital treatment and states that she is happy that every one is nice and treating her well. She has been compliant with medications and no side effect noted. She is able eat well all her meals as expected. Patient is willing to be placed at SNF and ask the social service to search in [place] because closer to her family members at this time. Patient is willing to participate in skilled nursing facility as a brief period until her daughter can find a place to live for her. Patient is considered medically and psychiatrically stable and ready for disposition plans. She is no longer have suicide ideation and danger to herself or others. Patient denies active suicidal/homicidal ideation, intention or plans. Patient has no evidence of psychosis.  Risk to Self: Is patient at risk for suicide?: NO Risk to Others:  NO Prior Inpatient Therapy:   Prior Outpatient Therapy:  NO  Past Medical History:  Past Medical History:  Diagnosis Date  . Arthritis   . Asthma   . Diabetes mellitus without complication ([place])   . GERD (gastroesophageal reflux disease)   . Hypertension   . Suicide attempt [facility])     Past Surgical History:  Procedure Laterality Date  . CHOLECYSTECTOMY     Family History:  Family History   Problem Relation Age of Onset  . Diabetes Mother   . Kidney failure Mother   . Diabetes Father   . Hypertension Father    Family Psychiatric  History: Patient denies family history of mental illness.  Social History:  History  Alcohol Use  . Yes    Comment: Occasional     History  Drug Use No    Social History   Social History  . Marital status: Single    Spouse name: N/A  . Number of children: N/A  . Years of education: N/A   Social History Main Topics  . Smoking status: Never Smoker  . Smokeless tobacco: Never Used  . Alcohol use Yes     Comment: Occasional  . Drug use: No  . Sexual activity: Not Asked   Other Topics Concern  . None   Social History Narrative  . None   Additional Social History:    Allergies:  No Known Allergies  Labs:  Results for orders placed or performed during the hospital encounter of 04/11/16 (from the past 48 hour(s))  Glucose, capillary     Status: Abnormal   Collection Time: 04/22/16  5:44 PM  Result Value Ref Range   Glucose-Capillary 109 (H) 65 - 99 mg/dL  Glucose, capillary     Status: Abnormal   Collection Time: 04/22/16  9:32 PM  Result Value Ref Range   Glucose-Capillary 122 (H) 65 - 99 mg/dL  Renal  function panel     Status: Abnormal   Collection Time: 04/23/16  4:52 AM  Result Value Ref Range   Sodium 140 135 - 145 mmol/L   Potassium 3.1 (L) 3.5 - 5.1 mmol/L    Comment: DELTA CHECK NOTED REPEATED TO VERIFY    Chloride 109 101 - 111 mmol/L   CO2 20 (L) 22 - 32 mmol/L   Glucose, Bld 82 65 - 99 mg/dL   BUN 57 (H) 6 - 20 mg/dL   Creatinine, Ser 4.10 (H) 0.44 - 1.00 mg/dL   Calcium 7.2 (L) 8.9 - 10.3 mg/dL   Phosphorus 6.4 (H) 2.5 - 4.6 mg/dL   Albumin 2.7 (L) 3.5 - 5.0 g/dL   GFR calc non Af Amer 12 (L) >60 mL/min   GFR calc Af Amer 14 (L) >60 mL/min    Comment: (NOTE) The eGFR has been calculated using the CKD EPI equation. This calculation has not been validated in all clinical situations. eGFR's  persistently <60 mL/min signify possible Chronic Kidney Disease.    Anion gap 11 5 - 15  Glucose, capillary     Status: None   Collection Time: 04/23/16  7:32 AM  Result Value Ref Range   Glucose-Capillary 93 65 - 99 mg/dL   Comment 1 Notify RN   Glucose, capillary     Status: Abnormal   Collection Time: 04/23/16 11:30 AM  Result Value Ref Range   Glucose-Capillary 153 (H) 65 - 99 mg/dL   Comment 1 Notify RN   Glucose, capillary     Status: None   Collection Time: 04/23/16  4:28 PM  Result Value Ref Range   Glucose-Capillary 89 65 - 99 mg/dL   Comment 1 Notify RN   Glucose, capillary     Status: Abnormal   Collection Time: 04/23/16 10:11 PM  Result Value Ref Range   Glucose-Capillary 149 (H) 65 - 99 mg/dL  Basic metabolic panel     Status: Abnormal   Collection Time: 04/24/16  5:46 AM  Result Value Ref Range   Sodium 137 135 - 145 mmol/L   Potassium 2.9 (L) 3.5 - 5.1 mmol/L   Chloride 106 101 - 111 mmol/L   CO2 21 (L) 22 - 32 mmol/L   Glucose, Bld 103 (H) 65 - 99 mg/dL   BUN 58 (H) 6 - 20 mg/dL   Creatinine, Ser 4.00 (H) 0.44 - 1.00 mg/dL   Calcium 7.1 (L) 8.9 - 10.3 mg/dL   GFR calc non Af Amer 12 (L) >60 mL/min   GFR calc Af Amer 14 (L) >60 mL/min    Comment: (NOTE) The eGFR has been calculated using the CKD EPI equation. This calculation has not been validated in all clinical situations. eGFR's persistently <60 mL/min signify possible Chronic Kidney Disease.    Anion gap 10 5 - 15  Glucose, capillary     Status: None   Collection Time: 04/24/16  7:43 AM  Result Value Ref Range   Glucose-Capillary 92 65 - 99 mg/dL   Comment 1 Notify RN   Glucose, capillary     Status: Abnormal   Collection Time: 04/24/16 12:08 PM  Result Value Ref Range   Glucose-Capillary 131 (H) 65 - 99 mg/dL   Comment 1 Notify RN     Current Facility-Administered Medications  Medication Dose Route Frequency Provider Last Rate Last Dose  . acetaminophen (TYLENOL) tablet 650 mg  650 mg Oral  Q6H PRN Jonetta Osgood, MD   650 mg at 04/22/16  2137   Or  . acetaminophen (TYLENOL) suppository 650 mg  650 mg Rectal Q6H PRN Jonetta Osgood, MD      . albuterol (PROVENTIL) (2.5 MG/3ML) 0.083% nebulizer solution 2.5 mg  2.5 mg Nebulization Q2H PRN Jonetta Osgood, MD   2.5 mg at 04/20/16 1112  . aspirin EC tablet 81 mg  81 mg Oral Daily Jonetta Osgood, MD   81 mg at 04/24/16 1127  . FLUoxetine (PROZAC) capsule 20 mg  20 mg Oral Daily [place] Marisa Hua, MD   20 mg at 04/24/16 1127  . fluticasone (FLONASE) 50 MCG/ACT nasal spray 1 spray  1 spray Each Nare BID Jonetta Osgood, MD   1 spray at 04/24/16 1126  . guaiFENesin-dextromethorphan (ROBITUSSIN DM) 100-10 MG/5ML syrup 5 mL  5 mL Oral Q4H PRN Venetia Maxon Rama, MD   5 mL at 04/23/16 1939  . heparin injection 5,000 Units  5,000 Units Subcutaneous Q8H Jonetta Osgood, MD   5,000 Units at 04/24/16 [phone]  . hydrALAZINE (APRESOLINE) tablet 50 mg  50 mg Oral Q8H Venetia Maxon Rama, MD   50 mg at 04/24/16 7017  . insulin aspart (novoLOG) injection 0-15 Units  0-15 Units Subcutaneous TID WC Jonetta Osgood, MD   2 Units at 04/24/16 1247  . insulin detemir (LEVEMIR) injection 10 Units  10 Units Subcutaneous BID Jonetta Osgood, MD   10 Units at 04/24/16 1127  . loratadine (CLARITIN) tablet 10 mg  10 mg Oral Daily Jonetta Osgood, MD   10 mg at 04/24/16 1127  . metoprolol tartrate (LOPRESSOR) tablet 25 mg  25 mg Oral BID Jonetta Osgood, MD   25 mg at 04/24/16 1127  . ondansetron (ZOFRAN-ODT) disintegrating tablet 4 mg  4 mg Oral Q8H PRN Gardiner Barefoot, NP      . pantoprazole (PROTONIX) EC tablet 40 mg  40 mg Oral Daily Jonetta Osgood, MD   40 mg at 04/24/16 1126  . pregabalin (LYRICA) capsule 25 mg  25 mg Oral QHS Jonetta Osgood, MD   25 mg at 04/23/16 2239  . QUEtiapine (SEROQUEL) tablet 25 mg  25 mg Oral TID Jonetta Osgood, MD   25 mg at 04/24/16 1126  . sodium bicarbonate tablet 1,300 mg  1,300 mg Oral TID Venetia Maxon Rama, MD   1,300 mg at 04/24/16 1126    Musculoskeletal: Strength & Muscle Tone: decreased Gait & Station: Bilateral BKA 2009. Patient leans: N/A  Psychiatric Specialty Exam: Physical Exam as per history and physical   ROS Complaining about depression, anxiety, restlessness, insomnia, hopelessness and worthlessness.   Blood pressure (!) 161/69, pulse 61, temperature 98.2 F (36.8 C), temperature source Oral, resp. rate 18, height _0  (1.702 m), weight (!) 156.5 kg (345 lb), last menstrual period 04/11/2008, SpO2 92 %.Body mass index is 54.03 kg/m.  General Appearance: Casual  Eye Contact:  Good  Speech:  Clear and Coherent  Volume:  Normal  Mood:  Euthymic  Affect:  Appropriate, Congruent and reactive to her mood. she is able to smile and laugh during this visit.   Thought Process:  Coherent and Goal Directed, worried about place to live and how people treat her.  Orientation:  Full (Time, Place, and Person)  Thought Content:  WDL  Suicidal Thoughts:  No, denied suicide ideation, intention and plans  Homicidal Thoughts:  No  Memory:  Immediate;   Fair Recent;   Fair  Judgement:  Fair  Insight:  Fair  Psychomotor Activity:  Decreased  Concentration:  Concentration: Fair and Attention Span: Fair  Recall:  Good  Fund of Knowledge:  Fair  Language:  Good  Akathisia:  Negative  Handed:  Right  AIMS (if indicated):     Assets:  Communication Skills Desire for Improvement Financial Resources/Insurance Leisure Time Resilience Social Support  ADL's:  Impaired  Cognition:  WNL  Sleep:        Treatment Plan Summary: Patient is psychiatrically cleared for SNF placement. She has no evidence of depression or SI/HI during this visit. Patient has been talking on phone ordering meals. She has been in contact with her sister and daughter on the phone. She has been calm and content today.   She is admitted with multiple medical problems, psychosocial stresses and depression,  anxiety, and denied active suicidal ideation without intention and plan. Based on my clinical impression patient current clinical presentation is secondary to psychosocial situation does not meet criteria for acute psychiatric hospitalization. Patient stated that she is waiting for the placement at [facility].  Safety concerns: Manufacturing engineer as patient contract for safety and has no active suicidal ideation, intention or plans.   Suicide/Homicidal ideation: Patient denies both suicide/homicidal ideation, intention or plans.   Depression: Fluoxetine 20 mg daily for depression  Mood swings and irritability: Seroquel 25 mg 3 times daily  Discussed case with [name] and need of SNF as she has no support from her daughter family and has chronic medical condition but no place to live.  Appreciate psychiatric consultation and we sign off as of today Please contact [phone] or [phone] if needs further assistance   Disposition: Patient benefit from short-term/brief skilled nursing facility until her family is able to find a place to live  Supportive therapy provided about ongoing stressors.  Ambrose Finland, MD 04/24/2016 1:30 PM

## 2016-04-24 NOTE — Progress Notes (Addendum)
Updated: Patient denied [facility] at [facility].  [facility] at [facility]- to follow up with [facility] if patient will be admitted.

## 2016-04-24 NOTE — Progress Notes (Signed)
  [facility] Progress Note   Subjective:  K low this AM Improving SCr very gradual Foley out On NaHCO3 PO TID  Vitals:   04/23/16 1334 04/23/16 1429 04/23/16 2057 04/24/16 0656  BP: (!) 155/79 130/60 (!) 127/57 (!) 161/69  Pulse: (!) 54 (!) 52 (!) 59 61  Resp:  '18 18 18  '$ Temp:  97.7 F (36.5 C) 98.3 F (36.8 C) 98.2 F (36.8 C)  TempSrc:  Oral Oral Oral  SpO2: 95% 100% 98% 92%  Weight:      Height:        Inpatient medications: . aspirin EC  81 mg Oral Daily  . FLUoxetine  20 mg Oral Daily  . fluticasone  1 spray Each Nare BID  . heparin  5,000 Units Subcutaneous Q8H  . hydrALAZINE  50 mg Oral Q8H  . insulin aspart  0-15 Units Subcutaneous TID WC  . insulin detemir  10 Units Subcutaneous BID  . loratadine  10 mg Oral Daily  . metoprolol tartrate  25 mg Oral BID  . pantoprazole  40 mg Oral Daily  . pregabalin  25 mg Oral QHS  . QUEtiapine  25 mg Oral TID  . sodium bicarbonate  1,300 mg Oral TID     acetaminophen **OR** acetaminophen, albuterol, guaiFENesin-dextromethorphan, ondansetron  Exam: Gen morbid obesity, alert, pleasant No rash, cyanosis or gangrene Sclera anicteric, throat clear  No jvd or bruits Chest clear bilat RRR no MRG  Abd soft ntnd no mass or ascites +bs marked obesity GU defer MS no joint effusions or deformity, bilat BKA well healed Ext no sig LE or UE edema / no wounds or ulcers Neuro is alert, Ox 3 , nf  Na 137  K 6.3  BUN 64  Cr 4.49   CO2 20   Ca 8.2  eGFR 12   Assessment: 1  CKD 4  - possible component of vol depletion w Cr bump and hyperkalemia.  Improved with IVFs 2  Hyperkalemia - resolved s/p kayexalate, now hypokalemic 3  HTN - on MTP and hydralazine 4  Volume - no edema on exam 5  Morbid obesity 6  GOC - being seen by pall care team 7  Depression / anxiety 8  DM2  Plan - Replete KCL this AM; arrange outpt f/u.  Will sign off for now.     Pearson Grippe MD 04/24/2016, 11:30 AM    Recent Labs Lab  04/22/16 0423 04/23/16 0452 04/24/16 0546  NA 138 140 137  K 4.4 3.1* 2.9*  CL 110 109 106  CO2 19* 20* 21*  GLUCOSE 123* 82 103*  BUN 62* 57* 58*  CREATININE 4.22* 4.10* 4.00*  CALCIUM 7.5* 7.2* 7.1*  PHOS 5.9* 6.4*  --     Recent Labs Lab 04/22/16 0423 04/23/16 0452  AST 20  --   ALT 14  --   ALKPHOS 66  --   BILITOT 0.4  --   PROT 4.9*  --   ALBUMIN 2.4* 2.7*   No results for input(s): WBC, NEUTROABS, HGB, HCT, MCV, PLT in the last 168 hours. Iron/TIBC/Ferritin/ %Sat No results found for: IRON, TIBC, FERRITIN, IRONPCTSAT

## 2016-04-25 DIAGNOSIS — E872 Acidosis: Secondary | ICD-10-CM

## 2016-04-25 DIAGNOSIS — Z89511 Acquired absence of right leg below knee: Secondary | ICD-10-CM

## 2016-04-25 DIAGNOSIS — E875 Hyperkalemia: Secondary | ICD-10-CM

## 2016-04-25 DIAGNOSIS — Z89512 Acquired absence of left leg below knee: Secondary | ICD-10-CM

## 2016-04-25 DIAGNOSIS — N184 Chronic kidney disease, stage 4 (severe): Secondary | ICD-10-CM

## 2016-04-25 LAB — GLUCOSE, CAPILLARY
Glucose-Capillary: 91 mg/dL (ref 65–99)
Glucose-Capillary: 103 mg/dL — ABNORMAL HIGH (ref 65–99)

## 2016-04-25 LAB — RENAL FUNCTION PANEL
ANION GAP: 8 (ref 5–15)
Albumin: 2.8 g/dL — ABNORMAL LOW (ref 3.5–5.0)
BUN: 55 mg/dL — ABNORMAL HIGH (ref 6–20)
CHLORIDE: 107 mmol/L (ref 101–111)
CO2: 22 mmol/L (ref 22–32)
Calcium: 7.4 mg/dL — ABNORMAL LOW (ref 8.9–10.3)
Creatinine, Ser: 3.81 mg/dL — ABNORMAL HIGH (ref 0.44–1.00)
GFR calc Af Amer: 15 mL/min — ABNORMAL LOW (ref >60)
GFR calc non Af Amer: 13 mL/min — ABNORMAL LOW (ref >60)
GLUCOSE: 82 mg/dL (ref 65–99)
PHOSPHORUS: 5.6 mg/dL — AB (ref 2.5–4.6)
POTASSIUM: 3.2 mmol/L — AB (ref 3.5–5.1)
Sodium: 137 mmol/L (ref 135–145)

## 2016-04-25 MED ORDER — SODIUM BICARBONATE 650 MG PO TABS: 1300.0000 mg | ORAL_TABLET | Freq: Three times a day (TID) | ORAL | 0 refills | Status: AC

## 2016-04-25 MED ORDER — POTASSIUM CHLORIDE CRYS ER 20 MEQ PO TBCR
20.0000 meq | EXTENDED_RELEASE_TABLET | Freq: Once | ORAL | Status: AC
  Administered 2016-04-25: 20 meq via ORAL
  Filled 2016-04-25: qty 1

## 2016-04-25 MED ORDER — POTASSIUM CHLORIDE CRYS ER 20 MEQ PO TBCR: 40.0000 meq | EXTENDED_RELEASE_TABLET | Freq: Once | ORAL | Status: DC

## 2016-04-25 NOTE — Progress Notes (Signed)
Report called to [employer] at [facility]. Answered all questions. Patient will be transported via [facility]. Discussed with patient. Patient aware.

## 2016-04-25 NOTE — Care Management Note (Signed)
Case Management Note  Patient Details  Name: [name] MRN: [id number] Date of Birth: [birth year]  Subjective/Objective:                    Action/Plan:d/c SNF.   Expected Discharge Date:   (unknown)               Expected Discharge Plan:  Skilled Nursing Facility  In-House Referral:  Clinical Social Work  Discharge planning Services  CM Consult  Post Acute Care Choice:  NA Choice offered to:  NA  DME Arranged:    DME Agency:  NA  HH Arranged:  NA HH Agency:  NA  Status of Service:  Completed, signed off  If discussed at [employer] of Stay Meetings, dates discussed:    Additional Comments:  Dessa Phi, RN 04/25/2016, 12:09 PM

## 2016-04-25 NOTE — Clinical Social Work Placement (Signed)
   CLINICAL SOCIAL WORK PLACEMENT  NOTE  Date:  04/25/2016  Patient Details  Name: [name] MRN: [id number] Date of Birth: October 06, 1965  Clinical Social Work is seeking post-discharge placement for this patient at the [place] level of care (*CSW will initial, date and re-position this form in  chart as items are completed):  Yes   Patient/family provided with [facility] Work Department's list of facilities offering this level of care within the geographic area requested by the patient (or if unable, by the patient's family).  Yes   Patient/family informed of their freedom to choose among providers that offer the needed level of care, that participate in Medicare, Medicaid or managed care program needed by the patient, have an available bed and are willing to accept the patient.  Yes   Patient/family informed of [facility]'s ownership interest in [facility] and [facility], as well as of the fact that they are under no obligation to receive care at these facilities.  PASRR submitted to EDS on 04/17/16     PASRR number received on 04/17/16     Existing PASRR number confirmed on       FL2 transmitted to all facilities in geographic area requested by pt/family on       FL2 transmitted to all facilities within larger geographic area on 04/12/16     Patient informed that his/her managed care company has contracts with or will negotiate with certain facilities, including the following:  [facility]     Yes   Patient/family informed of bed offers received.  Patient chooses bed at [facility]     Physician recommends and patient chooses bed at      Patient to be transferred to [facility] on 04/25/16.  Patient to be transferred to facility by [facility]     Patient family notified on 04/25/16 of transfer.  Name of family member notified:  CSW has not been able to contact family. Evening CSW  will attempt later time provided by patient. Daughter: Curly ShoresET:[phone]     PHYSICIAN Please prepare priority discharge summary, including medications, Please prepare prescriptions     Additional Comment:    _______________________________________________ Lia Hopping, [name] 04/25/2016, 11:59 AM

## 2016-04-25 NOTE — Progress Notes (Signed)
LCSWA and Psychiatrist MD. Met with patient at bedside. Patient alert and oriented. Reports she is feeling better. Patient reports she has been communicating with family.  She reports her daughter is having a difficult time finding a place to live and understands it will be a while before she can move into the home with her family.  Patient reports she would like to be placed in a nursing home close to family. LCSWA explained search process and informed patient the SNF will most likely be out area. Patient agreeable. Patient reports no concerns at this time.

## 2016-04-25 NOTE — Discharge Summary (Signed)
[facility] Discharge Summary   Patient: [name] [id number]   PCP: No primary care provider on file. 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 Please establish care with PCP and get BMP in 1 week also establish care with nephrology to decide regarding continuation of bicarb tablets.    Follow-up Information    [facility]. Schedule an appointment as soon as possible for a visit in 1 month(s).   Why:  Establish Care Regarding Kidney Care Contact information: [place] [address] [phone]        [facility]. Schedule an appointment as soon as possible for a visit in 1 month(s).   Why:  Establish Primary Care  Contact information: [address] [place] [phone]         Diet recommendation: renal and carb modified diet  Activity: The patient is advised to gradually reintroduce usual activities.  Discharge Condition: fair  Code Status: full code  History of present illness: As per the H and P dictated on admission, "[name] is a 50 y.o. female with medical history significant of of type 2 diabetes, hypertension, bilateral BKA, and occasional noncompliance prior to this admission, depression/bipolar disorder with prior suicidal attempt who initially was admitted to [facility] on 04/01/16 for evaluation of chest pain  during that visit she also claimed that she had suicidal ideation by overdosing on multiple medications. She was subsequently admitted to [facility], cardiac enzymes remained and negative. Cipro course was complicated by development of uncontrolled hypertension for which she was treated with multiple antihypertensives and a UTI for which she required intravenous antibiotics. She was followed by telemetry psychiatry at [facility], initially recommendations were to transfer to an inpatient psychiatric facility, however today psychiatry recommended that the patient be transferred to [facility] for face-to-face evaluation by a psychiatrist.   During my evaluation, patient denies any chest pain. She still acknowledges suicidal ideation. She also acknowledges that she was living with her daughter for the past 1 year in [place], [place], before that she was in a skilled nursing facility in [place], [place]. She unfortunately has no place to go upon discharge has not of her family members will be able to take her in. "  Hospital Course:  Summary of her active problems in the hospital is as following. Principal Problem:   Chest pain EKG and enzymes were negative at [facility] was admitted [facility] on 7/23 for this primary reason. No further workup planned. She does have advanced chronic kidney disease which limits the ability to do invasive procedures. She has not had any further chest pains. Continue aspirin.   Active Problems:   Hyperkalemia in the setting of stage IV chronic kidney disease Most likely reflective of her stage IV chronic kidney disease as she is not on any medications or supplements that would  raise her potassium. 12-lead EKG negative for peaked T waves. Potassium corrected status post Kayexalate, but now on the low side.  Prefer to avoid supplements if possible, as the patient enjoys sweet potatoes, etc. Recheck in 1 week    Depression  with suicidal ideation No further complaints of suicidal ideation. She does have disposition problems on discharge as well that may be contributing to her stress.  evaluated by psychiatrist on 04/24/16.  Recommendation is as per following, "Safety concerns: Manufacturing engineer as patient contract for safety and has no active suicidal ideation, intention or plans.  Suicide/Homicidal ideation: Patient denies both suicide/homicidal ideation, intention or plans.  Depression: Fluoxetine 20 mg daily for depression Mood swings and irritability: Seroquel 25 mg 3 times daily.    CKD (chronic kidney disease) stage 4, GFR 15-29 ml/min (HCC) complicated by hyperkalemia and metabolic acidosis Baseline creatinine is approximately 3.5 during her stay at [facility], but unclear if this is her typical baseline. Pt says she has never seen a nephrologist. Will need nephrology follow-up at discharge. Evaluated by nephrologist with recommendations to start IV fluids cautiously. Creatinine improved, discontinue IV fluids. Continue sodium bicarbonate. Kayexalate discontinued.    HTN (hypertension) Systolic blood pressures sporadically elevated. Increase hydralazine. Continue metoprolol.     DM type 2 ([place]) with multiple complications Currently being managed with 10 units of Levemir and moderate scale SSI 3 times a day.    S/P bilateral BKA (below knee amputation) ([place]) Peripheral neuropathy (HCC)/Morbid obesity ([place]) For SNF placement.    All other chronic medical condition were stable during the hospitalization.  Patient was seen by physical therapy, who recommended SNF, which was arranged by Education officer, museum and case Freight forwarder. On the day of the discharge the patient's vital were stable, and no other acute medical condition were reported by patient. the patient was felt safe to be discharge at SNF with therapy.  Procedures and Results:  none   Consultations:  Psychiatry  Palliative  Care  Nephrology  DISCHARGE MEDICATION: Current Discharge Medication List    START taking these medications   Details  sodium bicarbonate 650 MG tablet Take 2 tablets (1,300 mg total) by mouth 3 (three) times daily. Qty: 30 tablet, Refills: 0      CONTINUE these medications which have CHANGED   Details  FLUoxetine (PROZAC) 20 MG tablet Take 1 tablet (20 mg total) by mouth daily. Qty: 30 tablet, Refills: 0    hydrALAZINE (APRESOLINE) 25 MG tablet Take 1 tablet (25 mg total) by mouth every 8 (eight) hours. Qty: 90 tablet, Refills: 0    ipratropium-albuterol (DUONEB) 0.5-2.5 (3) MG/3ML SOLN Take 3 mLs by nebulization every 4 (four) hours as needed (for wheezing/shortness of breath). Qty: 360 mL, Refills: 0      CONTINUE these medications which have NOT CHANGED   Details  acetaminophen (TYLENOL) 650 MG CR tablet Take 650 mg by mouth every 6 (six) hours as needed for pain or fever.    aspirin 81 MG chewable tablet Chew 81 mg by mouth daily.    fluticasone (FLONASE) 50 MCG/ACT nasal spray Place 1 spray into both nostrils 2 (two) times daily.    insulin detemir (LEVEMIR) 100 UNIT/ML injection Inject 10 Units into the skin 2 (two) times daily.    loratadine (CLARITIN) 10 MG tablet Take 10 mg by mouth daily.    metoprolol tartrate (LOPRESSOR) 25 MG tablet Take 25 mg by mouth 2 (two) times daily.    pregabalin (LYRICA) 25 MG capsule Take  25 mg by mouth at bedtime.    QUEtiapine (SEROQUEL) 25 MG tablet Take 25 mg by mouth 3 (three) times daily.      STOP taking these medications     heparin 5000 UNIT/ML injection      insulin regular (NOVOLIN R,HUMULIN R) 100 units/mL injection        No Known Allergies Discharge Instructions    Diet - low sodium heart healthy    Complete by:  As directed   Diet Carb Modified    Complete by:  As directed   Diet renal 60/70-10-12-1198    Complete by:  As directed   Discharge instructions    Complete by:  As directed   It is important that  you read following instructions as well as go over your medication list with RN to help you understand your care after this hospitalization.  Discharge Instructions: Please follow-up with PCP in one week with BMP, and establish care with nephrology to decide whether to continue bicarbonate tablets or not.   Please request your primary care physician to go over all Hospital Tests and Procedure/Radiological results at the follow up,  Please get all Hospital records sent to your PCP by signing hospital release before you go home.   Do not drive, operating heavy machinery, perform activities at heights, swimming or participation in water activities or provide baby sitting services; until you have been seen by Primary Care Physician or a Neurologist and advised to do so again. Do not take more than prescribed Pain, Sleep and Anxiety Medications. You were cared for by a hospitalist during your hospital stay. If you have any questions about your discharge medications or the care you received while you were in the hospital after you are discharged, you can call the unit and ask to speak with the hospitalist on call if the hospitalist that took care of you is not available.  Once you are discharged, your primary care physician will handle any further medical issues. Please note that NO REFILLS for any discharge medications will be authorized once you are discharged, as it is imperative that you return to your primary care physician (or establish a relationship with a primary care physician if you do not have one) for your aftercare needs so that they can reassess your need for medications and monitor your lab values. You Must read complete instructions/literature along with all the possible adverse reactions/side effects for all the Medicines you take and that have been prescribed to you. Take any new Medicines after you have completely understood and accept all the possible adverse reactions/side effects. Wear  Seat belts while driving. If you have smoked or chewed Tobacco in the last 2 yrs please stop smoking and/or stop any Recreational drug use.   Increase activity slowly    Complete by:  As directed   Increase activity slowly    Complete by:  As directed     Discharge Exam: Filed Weights   04/11/16 1520 04/19/16 0450  Weight: (!) 160.6 kg (354 lb) (!) 156.5 kg (345 lb)   Vitals:   04/24/16 2150 04/25/16 0424  BP: (!) 142/65 (!) 148/73  Pulse: (!) 59 (!) 52  Resp: 18 18  Temp: 98.4 F (36.9 C) 98.1 F (36.7 C)   General: Appear in no distress, no Rash; Oral Mucosa moist. No SI or HI Cardiovascular: S1 and S2 Present, no Murmur, no JVD Respiratory: Bilateral Air entry present and Clear to Auscultation, no Crackles, no wheezes Abdomen: Bowel  Sound present, Soft and no tenderness Extremities: no Pedal edema, bilateral BKA Neurology: Grossly no focal neuro deficit.  The results of significant diagnostics from this hospitalization (including imaging, microbiology, ancillary and laboratory) are listed below for reference.    Significant Diagnostic Studies: US Renal  Result Date: 04/22/2016 CLINICAL DATA:  Chronic kidney disease. EXAM: RENAL / URINARY TRACT ULTRASOUND COMPLETE COMPARISON:  CT, 10/30/2010 FINDINGS: Right Kidney: Length: 11.1 cm. Echogenicity within normal limits. No mass or hydronephrosis visualized. Left Kidney: Length: 12.6 cm. Echogenicity within normal limits. No mass or hydronephrosis visualized. Bladder: Appears normal for degree of bladder distention. IMPRESSION: Normal renal ultrasound.  No hydronephrosis. Electronically Signed   By: Lajean Manes M.D.   On: 04/22/2016 09:00    Microbiology: No results found for this or any previous visit (from the past 240 hour(s)).   Labs: CBC: No results for input(s): WBC, NEUTROABS, HGB, HCT, MCV, PLT in the last 168 hours. Basic Metabolic Panel:  Recent Labs Lab 04/21/16 0602 04/22/16 0423 04/23/16 0452 04/24/16 0546  04/25/16 0544  NA 137 138 140 137 137  K 6.3* 4.4 3.1* 2.9* 3.2*  CL 111 110 109 106 107  CO2 20* 19* 20* 21* 22  GLUCOSE 86 123* 82 103* 82  BUN 64* 62* 57* 58* 55*  CREATININE 4.49* 4.22* 4.10* 4.00* 3.81*  CALCIUM 8.2* 7.5* 7.2* 7.1* 7.4*  PHOS  --  5.9* 6.4*  --  5.6*   Liver Function Tests:  Recent Labs Lab 04/22/16 0423 04/23/16 0452 04/25/16 0544  AST 20  --   --   ALT 14  --   --   ALKPHOS 66  --   --   BILITOT 0.4  --   --   PROT 4.9*  --   --   ALBUMIN 2.4* 2.7* 2.8*   No results for input(s): LIPASE, AMYLASE in the last 168 hours. No results for input(s): AMMONIA in the last 168 hours. Cardiac Enzymes: No results for input(s): CKTOTAL, CKMB, CKMBINDEX, TROPONINI in the last 168 hours. BNP (last 3 results) No results for input(s): BNP in the last 8760 hours. CBG:  Recent Labs Lab 04/24/16 0743 04/24/16 1208 04/24/16 1644 04/24/16 2148 04/25/16 0738  GLUCAP 92 131* 227* 124* 91   Time spent: 30 minutes  Signed:  Tahir Blank  [facility]  04/25/2016  , 11:50 AM

## 2016-04-25 NOTE — Progress Notes (Addendum)
Patient has been accepted to [facility]. DC summary Faxed and sent to [facility]  Patient aware and agreeable at this time.  Discussed transport with [facility]. Patient will be transported via [facility]. [facility] to arrive around 2:00pm.

## 2020-05-13 ENCOUNTER — Emergency Department (HOSPITAL_COMMUNITY): Payer: Medicaid Other

## 2020-05-13 ENCOUNTER — Emergency Department (HOSPITAL_COMMUNITY)
Admission: EM | Admit: 2020-05-13 | Discharge: 2020-05-13 | Disposition: A | Discharge status: Home / Self Care | Payer: Medicaid Other | Attending: Emergency Medicine | Admitting: Emergency Medicine

## 2020-05-13 DIAGNOSIS — Z794 Long term (current) use of insulin: Secondary | ICD-10-CM | POA: Diagnosis not present

## 2020-05-13 DIAGNOSIS — R1110 Vomiting, unspecified: Secondary | ICD-10-CM | POA: Diagnosis not present

## 2020-05-13 DIAGNOSIS — I120 Hypertensive chronic kidney disease with stage 5 chronic kidney disease or end stage renal disease: Secondary | ICD-10-CM | POA: Diagnosis not present

## 2020-05-13 DIAGNOSIS — Z79899 Other long term (current) drug therapy: Secondary | ICD-10-CM | POA: Insufficient documentation

## 2020-05-13 DIAGNOSIS — Z7982 Long term (current) use of aspirin: Secondary | ICD-10-CM | POA: Diagnosis not present

## 2020-05-13 DIAGNOSIS — R569 Unspecified convulsions: Secondary | ICD-10-CM

## 2020-05-13 DIAGNOSIS — J45909 Unspecified asthma, uncomplicated: Secondary | ICD-10-CM | POA: Diagnosis not present

## 2020-05-13 DIAGNOSIS — R404 Transient alteration of awareness: Secondary | ICD-10-CM | POA: Insufficient documentation

## 2020-05-13 DIAGNOSIS — N186 End stage renal disease: Secondary | ICD-10-CM | POA: Diagnosis not present

## 2020-05-13 DIAGNOSIS — R410 Disorientation, unspecified: Secondary | ICD-10-CM

## 2020-05-13 DIAGNOSIS — R531 Weakness: Secondary | ICD-10-CM | POA: Diagnosis present

## 2020-05-13 DIAGNOSIS — R55 Syncope and collapse: Secondary | ICD-10-CM | POA: Insufficient documentation

## 2020-05-13 LAB — I-STAT CHEM 8, ED
BUN: 19 mg/dL (ref 6–20)
Calcium, Ion: 1.18 mmol/L (ref 1.15–1.40)
Chloride: 93 mmol/L — ABNORMAL LOW (ref 98–111)
Creatinine, Ser: 5 mg/dL — ABNORMAL HIGH (ref 0.44–1.00)
Glucose, Bld: 216 mg/dL — ABNORMAL HIGH (ref 70–99)
HCT: 41 % (ref 36.0–46.0)
Hemoglobin: 13.9 g/dL (ref 12.0–15.0)
Potassium: 4.1 mmol/L (ref 3.5–5.1)
Sodium: 137 mmol/L (ref 135–145)
TCO2: 31 mmol/L (ref 22–32)

## 2020-05-13 LAB — DIFFERENTIAL
Abs Immature Granulocytes: 0.04 10*3/uL (ref 0.00–0.07)
Basophils Absolute: 0 10*3/uL (ref 0.0–0.1)
Basophils Relative: 0 %
Eosinophils Absolute: 0.2 10*3/uL (ref 0.0–0.5)
Eosinophils Relative: 2 %
Immature Granulocytes: 0 %
Lymphocytes Relative: 22 %
Lymphs Abs: 2.3 10*3/uL (ref 0.7–4.0)
Monocytes Absolute: 0.5 10*3/uL (ref 0.1–1.0)
Monocytes Relative: 5 %
Neutro Abs: 7.3 10*3/uL (ref 1.7–7.7)
Neutrophils Relative %: 71 %

## 2020-05-13 LAB — PROTIME-INR
INR: 1.5 — ABNORMAL HIGH (ref 0.8–1.2)
INR: 1.5 — ABNORMAL HIGH (ref 0.8–1.2)
Prothrombin Time: 17.6 seconds — ABNORMAL HIGH (ref 11.4–15.2)
Prothrombin Time: 17.8 seconds — ABNORMAL HIGH (ref 11.4–15.2)

## 2020-05-13 LAB — CBG MONITORING, ED: Glucose-Capillary: 200 mg/dL — ABNORMAL HIGH (ref 70–99)

## 2020-05-13 LAB — COMPREHENSIVE METABOLIC PANEL
ALT: 11 U/L (ref 0–44)
AST: 14 U/L — ABNORMAL LOW (ref 15–41)
Albumin: 4.1 g/dL (ref 3.5–5.0)
Alkaline Phosphatase: 146 U/L — ABNORMAL HIGH (ref 38–126)
Anion gap: 17 — ABNORMAL HIGH (ref 5–15)
BUN: 16 mg/dL (ref 6–20)
CO2: 27 mmol/L (ref 22–32)
Calcium: 10.4 mg/dL — ABNORMAL HIGH (ref 8.9–10.3)
Chloride: 91 mmol/L — ABNORMAL LOW (ref 98–111)
Creatinine, Ser: 5.11 mg/dL — ABNORMAL HIGH (ref 0.44–1.00)
GFR calc Af Amer: 10 mL/min — ABNORMAL LOW (ref >60)
GFR calc non Af Amer: 9 mL/min — ABNORMAL LOW (ref >60)
Glucose, Bld: 231 mg/dL — ABNORMAL HIGH (ref 70–99)
Potassium: 3.7 mmol/L (ref 3.5–5.1)
Sodium: 135 mmol/L (ref 135–145)
Total Bilirubin: 0.7 mg/dL (ref 0.3–1.2)
Total Protein: 8.1 g/dL (ref 6.5–8.1)

## 2020-05-13 LAB — CBC
HCT: 40.1 % (ref 36.0–46.0)
Hemoglobin: 12.1 g/dL (ref 12.0–15.0)
MCH: 28.1 pg (ref 26.0–34.0)
MCHC: 30.2 g/dL (ref 30.0–36.0)
MCV: 93.3 fL (ref 80.0–100.0)
Platelets: 221 10*3/uL (ref 150–400)
RBC: 4.3 MIL/uL (ref 3.87–5.11)
RDW: 15.9 % — ABNORMAL HIGH (ref 11.5–15.5)
WBC: 10.3 10*3/uL (ref 4.0–10.5)
nRBC: 0 % (ref 0.0–0.2)

## 2020-05-13 LAB — APTT
aPTT: 35 seconds (ref 24–36)
aPTT: 37 seconds — ABNORMAL HIGH (ref 24–36)

## 2020-05-13 MED ORDER — LORAZEPAM 2 MG/ML IJ SOLN: 1.0000 mg | Freq: Once | INTRAMUSCULAR | Status: DC | PRN

## 2020-05-13 MED ORDER — SODIUM CHLORIDE 0.9% FLUSH: 3.0000 mL | Freq: Once | INTRAVENOUS | Status: DC

## 2020-05-13 MED ORDER — IOHEXOL 350 MG/ML SOLN: 100.0000 mL | Freq: Once | INTRAVENOUS | Status: DC | PRN

## 2020-05-13 NOTE — ED Notes (Signed)
Pt discharged via [facility]. Pt states she came with glasses and pants but staff unable to locate these items. Pt AOx4, VSS at time of discharge.

## 2020-05-13 NOTE — ED Provider Notes (Signed)
  Provider Note MRN:  [id number]  Arrival date & time: 05/13/20    ED Course and Medical Decision Making  Assumed care from Dr. Billy Fischer at shift change.  Thorough evaluation reveals no new strokes or acute process, EEG is completely normal.  On reassessment patient is awake, alert, able to converse.  She intermittently has stuttered or issues with speech, but at times she is quite fluent.  Discussed case again with Dr. Lorrin Goodell, who is in favor of a functional disorder.  Patient does have a psychiatric history.  Also on the differential would be some type of medical encephalopathy, however the remainder of the work-up is overall reassuring without any evidence of such.  No signs of emergent process, patient is appropriate for discharge.  Procedures  Final Clinical Impressions(s) / ED Diagnoses     ICD-10-CM   1. Transient disorientation  R41.0     ED Discharge Orders    None        Discharge Instructions     You were evaluated in the Emergency Department and after careful evaluation, we did not find any emergent condition requiring admission or further testing in the hospital.  Your exam/testing today is overall reassuring.  Please return to the Emergency Department if you experience any worsening of your condition.   Thank you for allowing [place] to be a part of your care.    Barth Kirks. Sedonia Small, [place] [email or web address]    Maudie Flakes, MD 05/13/20 [phone]

## 2020-05-13 NOTE — Procedures (Signed)
Patient Name: MALASIA TORAIN  MRN: [id number]  Epilepsy Attending: Lora Havens  Referring Physician/Provider: Etta Quill, PA Date: 05/13/2020 Duration: 25.01 mins  Patient history: 54yo F wit 3 minute unresponsive episode in dialysis, followed by concern for R sided weakness. EEG to evaluate for seizure.  Level of alertness: Awake  AEDs during EEG study: None  Technical aspects: This EEG study was done with scalp electrodes positioned according to the 10-20 International system of electrode placement. Electrical activity was acquired at a sampling rate of 500Hz  and reviewed with a high frequency filter of 70Hz  and a low frequency filter of 1Hz . EEG data were recorded continuously and digitally stored.   Description: The posterior dominant rhythm consists of 8 Hz activity of moderate voltage (25-35 uV) seen predominantly in posterior head regions, symmetric and reactive to eye opening and eye closing. Hyperventilation and photic stimulation were not performed.     IMPRESSION: This study is within normal limits. No seizures or epileptiform discharges were seen throughout the recording.  Aubrey Voong Barbra Sarks

## 2020-05-13 NOTE — Consult Note (Signed)
Neurology Consultation  Reason for Consult: Code stroke Referring Physician: Dr. Billy Fischer  CC: Right-sided deficits along with nonverbal  History is obtained from: EMS  HPI: TERRILL WAUTERS is a 54 y.o. female with past medical history of diabetes, GERD, hypertension.  Patient lives at a rest home.  Patient is a dialysis patient who was actually at dialysis today.  She started her dialysis at 823 this morning.  At that time she was at her normal baseline.  She had started it however, part way into it she became unresponsive for approximately 3 minutes and then had a period of emesis.  After this point she came around but then was noted to be nonverbal.  EMS was called due to the symptoms of nonverbal, right arm not working at that time and right facial droop.  At the time of EMS arrival they noted that the patient was not moving her right arm, she had a right facial droop but she was nodding to answers with her head.  She had no gaze preference.  Her blood pressure was 130/80 and her blood glucose was 223.  Upon arrival to CT, patient was a severely hard stick thus ultrasound noted to be used.  While attempting to place IV patient slowly became more verbal, answers more questions correctly, was following commands and slowly improving.  Patient was not able to give any history of the event that happened.   LKW: 2993 on 05/13/2020 tpa given?: no, likely encephalopathy with inconsistent exam. Premorbid modified Rankin scale (mRS): 4 NIHSS 1a Level of Conscious.: 0 1b LOC Questions: 0 1c LOC Commands: 0 2 Best Gaze: 0 3 Visual: 0 4 Facial Palsy: 0 5a Motor Arm - left: 1 5b Motor Arm - Right: 1 6a Motor Leg - Left: 1 6b Motor Leg - Right: 3 7 Limb Ataxia: 0 8 Sensory: 0 9 Best Language: 1 10 Dysarthria: 1 11 Extinct. and Inatten.: 0 TOTAL: 8  Past Medical History:  Diagnosis Date  . Arthritis   . Asthma   . Diabetes mellitus without complication ([place])   . GERD (gastroesophageal  reflux disease)   . Hypertension   . Suicide attempt     Family History  Problem Relation Age of Onset  . Diabetes Mother   . Kidney failure Mother   . Diabetes Father   . Hypertension Father    Social History:   reports that she has never smoked. She has never used smokeless tobacco. She reports current alcohol use. She reports that she does not use drugs.  Medications  Current Facility-Administered Medications:  .  iohexol (OMNIPAQUE) 350 MG/ML injection 100 mL, 100 mL, Intravenous, Once PRN, Donnetta Simpers, MD .  sodium chloride flush (NS) 0.9 % injection 3 mL, 3 mL, Intravenous, Once, Gareth Morgan, MD  Current Outpatient Medications:  .  acetaminophen (TYLENOL) 650 MG CR tablet, Take 650 mg by mouth every 6 (six) hours as needed for pain or fever., Disp: , Rfl:  .  aspirin 81 MG chewable tablet, Chew 81 mg by mouth daily., Disp: , Rfl:  .  FLUoxetine (PROZAC) 20 MG tablet, Take 1 tablet (20 mg total) by mouth daily., Disp: 30 tablet, Rfl: 0 .  fluticasone (FLONASE) 50 MCG/ACT nasal spray, Place 1 spray into both nostrils 2 (two) times daily., Disp: , Rfl:  .  hydrALAZINE (APRESOLINE) 25 MG tablet, Take 1 tablet (25 mg total) by mouth every 8 (eight) hours., Disp: 90 tablet, Rfl: 0 .  insulin detemir (LEVEMIR) 100  UNIT/ML injection, Inject 10 Units into the skin 2 (two) times daily., Disp: , Rfl:  .  ipratropium-albuterol (DUONEB) 0.5-2.5 (3) MG/3ML SOLN, Take 3 mLs by nebulization every 4 (four) hours as needed (for wheezing/shortness of breath)., Disp: 360 mL, Rfl: 0 .  loratadine (CLARITIN) 10 MG tablet, Take 10 mg by mouth daily., Disp: , Rfl:  .  metoprolol tartrate (LOPRESSOR) 25 MG tablet, Take 25 mg by mouth 2 (two) times daily., Disp: , Rfl:  .  pregabalin (LYRICA) 25 MG capsule, Take 25 mg by mouth at bedtime., Disp: , Rfl:  .  QUEtiapine (SEROQUEL) 25 MG tablet, Take 25 mg by mouth 3 (three) times daily., Disp: , Rfl:  .  sodium bicarbonate 650 MG tablet, Take 2  tablets (1,300 mg total) by mouth 3 (three) times daily., Disp: 30 tablet, Rfl: 0  ROS:   General ROS: negative for - chills, fatigue, fever, night sweats, weight gain or weight loss Psychological ROS: negative for - behavioral disorder, hallucinations, memory difficulties, mood swings or suicidal ideation Ophthalmic ROS: negative for - blurry vision, double vision, eye pain or loss of vision ENT ROS: negative for - epistaxis, nasal discharge, oral lesions, sore throat, tinnitus or vertigo Allergy and Immunology ROS: negative for - hives or itchy/watery eyes Hematological and Lymphatic ROS: negative for - bleeding problems, bruising or swollen lymph nodes Endocrine ROS: negative for - galactorrhea, hair pattern changes, polydipsia/polyuria or temperature intolerance Respiratory ROS: negative for - cough, hemoptysis, shortness of breath or wheezing Cardiovascular ROS: negative for - chest pain, dyspnea on exertion, edema or irregular heartbeat Gastrointestinal ROS: negative for - abdominal pain, diarrhea, hematemesis, nausea/vomiting or stool incontinence Genito-Urinary ROS: negative for - dysuria, hematuria, incontinence or urinary frequency/urgency Musculoskeletal ROS: Positive for - jmuscular weakness Neurological ROS: as noted in HPI Dermatological ROS: negative for rash and skin lesion changes  Exam: Current vital signs: Wt 133.6 kg   BMI 46.13 kg/m  Vital signs in last 24 hours: Weight:  [133.6 kg] 133.6 kg (09/03 1300)   Constitutional: Appears well-developed and well-nourished.  Eyes: No scleral injection HENT: No OP obstrucion Head: Normocephalic.  Cardiovascular: Normal rate and regular rhythm.  Respiratory: Effort normal, non-labored breathing GI: Soft.  No distension. There is no tenderness.  Skin: WDI  Neuro: Mental Status: Patient is awake, alert, oriented to person, able to name objects, follow commands, however was aware of situation.  Patient showed mild aphasia  and mild to moderate dysarthria. Cranial Nerves: II: Blinks to threat bilaterally III,IV, VI: EOMI without ptosis or diploplia. Pupils equal, round and reactive to light V: Facial sensation is symmetric to temperature VII: Facial movement is symmetric.  VIII: hearing is intact to voice X: Palat elevates symmetrically XI: Shoulder shrug is symmetric. XII: tongue is midline without atrophy or fasciculations.  Motor: Patient showed drift on the right arm, left leg, left arm and was showing no effort against gravity on the right leg but this is baseline for her.  It should be noted that during the exam patient's upper extremity and lower extremity drift varied and was inconsistent. Sensory: Sensation is symmetric to scratching all extremities Deep Tendon Reflexes: No patellar reflexes noted 1+ reflexes in brachioradialis Plantars: Bilateral BKA Cerebellar:  no dysmetria finger to nose  Labs I have reviewed labs in epic and the results pertinent to this consultation are:   CBC    Component Value Date/Time   WBC 6.8 04/12/2016 0543   RBC 3.05 (L) 04/12/2016 0543   HGB  13.9 05/13/2020 1342   HCT 41.0 05/13/2020 1342   PLT 300 04/12/2016 0543   MCV 83.0 04/12/2016 0543   MCH 25.9 (L) 04/12/2016 0543   MCHC 31.2 04/12/2016 0543   RDW 15.0 04/12/2016 0543    CMP     Component Value Date/Time   NA 137 05/13/2020 1342   K 4.1 05/13/2020 1342   CL 93 (L) 05/13/2020 1342   CO2 22 04/25/2016 0544   GLUCOSE 216 (H) 05/13/2020 1342   BUN 19 05/13/2020 1342   CREATININE 5.00 (H) 05/13/2020 1342   CALCIUM 7.4 (L) 04/25/2016 0544   PROT 4.9 (L) 04/22/2016 0423   ALBUMIN 2.8 (L) 04/25/2016 0544   AST 20 04/22/2016 0423   ALT 14 04/22/2016 0423   ALKPHOS 66 04/22/2016 0423   BILITOT 0.4 04/22/2016 0423   GFRNONAA 13 (L) 04/25/2016 0544   GFRAA 15 (L) 04/25/2016 0544     Imaging I have reviewed the images obtained:  CT-scan of the brain-no acute intracranial hemorrhage or  evidence of acute infarction.    Etta Quill PA-C [facility] [phone]  M-F  (9:00 am- 5:00 PM)  05/13/2020, 2:08 PM     Assessment:  54 year old female presented to the hospital as code stroke after having a loss of consciousness during dialysis along with emesis.  This was followed by patient nonverbal, right-sided weakness and possible right facial droop.  On exam patient showed some inconsistencies with both speaking-as initially she was mute however with more coaxing she was able to name objects and her name.  Patient did have drift in all extremities, however again this was inconsistent.  At this point in time suspicion for stroke is low however will bring patient to MRI to further evaluate if she still is in the window for TPA.  Other possibilities include seizure thus will obtain EEG.  Recommendations: MRI brain without contrast-if negative no further stroke work-up warranted EEG routine

## 2020-05-13 NOTE — Progress Notes (Signed)
Left forearm graft deacessed, pressure dreesing applied, no unusual bleeding noted.

## 2020-05-13 NOTE — Code Documentation (Signed)
Stroke Response Nurse Documentation Code Documentation  [name] is a 54 y.o. female arriving to [facility] ED via [facility] on 05/13/2020 with past medical hx of CKD on dialysis. Code stroke was activated by EMS. Patient from dialysis where she was LKW at 1215 and had a sudden onset of unresponsiveness followed by an episode of vomiting while receiving her dialysis. Called EMS and pt was noted not to be speaking and had some right sided weakness per staff. Pt has hx of stroke and bilateral leg amputation. Reports right leg weakness at baseline. Stroke team at the bedside on patient arrival. Labs drawn and patient cleared for CT by Dr. Billy Fischer. Patient to CT with team. NIHSS 12 and then 7 upon arrival to MRI, see documentation for details and code stroke times. Patient with bilateral arm weakness, bilateral leg weakness and Expressive aphasia  on exam. The following imaging was completed:  CT. CTA was not completed due to her CKD and patient was taken to MRI instead. Patient is not a candidate for tPA due to MRI being negative per MD. Bedside handoff with ED RN Judson Roch.    Kathrin Greathouse  Stroke Response RN

## 2020-05-13 NOTE — Discharge Instructions (Addendum)
You were evaluated in the Emergency Department and after careful evaluation, we did not find any emergent condition requiring admission or further testing in the hospital.  Your exam/testing today is overall reassuring.  Please return to the Emergency Department if you experience any worsening of your condition.   Thank you for allowing us to be a part of your care. 

## 2020-05-13 NOTE — Progress Notes (Signed)
EEG complete - results pending 

## 2020-05-13 NOTE — ED Triage Notes (Signed)
Pt code stroke, LSN 1460. Pt at dialysis, had syncopal episode lasting five minutes. Pt then woke up, was nonverbal, had one episode of vomiting, R facial droop. Pt given 1L NS by dialysis. Only completed approx half of treatment before transport to hospital.

## 2020-05-13 NOTE — ED Provider Notes (Signed)
[facility] EMERGENCY DEPARTMENT Provider Note   CSN: [id number] Arrival date & time: 05/13/20  1332  An emergency department physician performed an initial assessment on this suspected stroke patient at 1215.  History Chief Complaint  Patient presents with  . Code Stroke    [name] is a 54 y.o. female.  HPI      54yo female with history of DM, hypertension, ESRD on dialysis who presents as a code stroke from dialysis.  Hx of unresponsive episode followed by right sided weakness.  History limited by acuity of condition and pt ability to speak/communicate on exam. . Reports she woke up this morning not feeling well but not able to elaborate and not able to describe event that brought her here. Denies fevers, cough, chest pain.  LNW 1215  Past Medical History:  Diagnosis Date  . Arthritis   . Asthma   . Diabetes mellitus without complication ([place])   . GERD (gastroesophageal reflux disease)   . Hypertension   . Suicide attempt     Patient Active Problem List   Diagnosis Date Noted  . Encounter for palliative care   . Goals of care, counseling/discussion   . Hyperkalemia 04/20/2016  . Metabolic acidosis 38/25/0539  . Chest pain 04/11/2016  . Depression 04/11/2016  . CKD (chronic kidney disease) stage 4, GFR 15-29 ml/min (HCC) 04/11/2016  . HTN (hypertension) 04/11/2016  . DM coma, type 2 ([place]) 04/11/2016  . S/P bilateral BKA (below knee amputation) ([place]) 04/11/2016  . Peripheral neuropathy 04/11/2016  . Morbid obesity ([place]) 04/11/2016    Past Surgical History:  Procedure Laterality Date  . CHOLECYSTECTOMY       OB History   No obstetric history on file.     Family History  Problem Relation Age of Onset  . Diabetes Mother   . Kidney failure Mother   . Diabetes Father   . Hypertension Father     Social History   Tobacco Use  . Smoking status: Never Smoker  . Smokeless tobacco: Never Used  Substance Use Topics  . Alcohol use:  Yes    Comment: Occasional  . Drug use: No    Home Medications Prior to Admission medications   Medication Sig Start Date End Date Taking? Authorizing Provider  acetaminophen (TYLENOL) 650 MG CR tablet Take 650 mg by mouth every 6 (six) hours as needed for pain or fever.    [provider]  aspirin 81 MG chewable tablet Chew 81 mg by mouth daily.    [provider]  FLUoxetine (PROZAC) 20 MG tablet Take 1 tablet (20 mg total) by mouth daily. 04/17/16   Johnson, Clanford L, MD  fluticasone (FLONASE) 50 MCG/ACT nasal spray Place 1 spray into both nostrils 2 (two) times daily.    [provider]  hydrALAZINE (APRESOLINE) 25 MG tablet Take 1 tablet (25 mg total) by mouth every 8 (eight) hours. 04/17/16   Johnson, Clanford L, MD  insulin detemir (LEVEMIR) 100 UNIT/ML injection Inject 10 Units into the skin 2 (two) times daily.    [provider]  ipratropium-albuterol (DUONEB) 0.5-2.5 (3) MG/3ML SOLN Take 3 mLs by nebulization every 4 (four) hours as needed (for wheezing/shortness of breath). 04/17/16   Johnson, Clanford L, MD  loratadine (CLARITIN) 10 MG tablet Take 10 mg by mouth daily.    [provider]  metoprolol tartrate (LOPRESSOR) 25 MG tablet Take 25 mg by mouth 2 (two) times daily.    [provider]  pregabalin (LYRICA) 25 MG capsule Take 25 mg by mouth at bedtime.    [provider]  QUEtiapine (SEROQUEL) 25 MG tablet Take 25 mg by mouth 3 (three) times daily.    [provider]  sodium bicarbonate 650 MG tablet Take 2 tablets (1,300 mg total) by mouth 3 (three) times daily. 04/25/16   Lavina Hamman, MD    Allergies    Patient has no known allergies.  Review of Systems   Review of Systems  Unable to perform ROS: Mental status change  Constitutional: Negative for fever.  HENT: Negative for sore throat.   Eyes: Negative for visual disturbance.  Respiratory: Negative for cough and shortness of breath.     Cardiovascular: Negative for chest pain.  Gastrointestinal: Positive for vomiting (one episode). Negative for abdominal pain and nausea.  Genitourinary: Negative for difficulty urinating.  Musculoskeletal: Negative for back pain and neck pain.  Skin: Negative for rash.  Neurological: Positive for syncope and weakness.    Physical Exam Updated Vital Signs BP 130/83 (BP Location: Right Arm)   Pulse 80   Temp 99.1 F (37.3 C) (Axillary)   Resp 18   Wt 133.6 kg   SpO2 99%   BMI 46.13 kg/m   Physical Exam Vitals and nursing note reviewed.  Constitutional:      General: She is not in acute distress.    Appearance: She is well-developed. She is not diaphoretic.  HENT:     Head: Normocephalic and atraumatic.  Eyes:     Conjunctiva/sclera: Conjunctivae normal.  Cardiovascular:     Rate and Rhythm: Normal rate and regular rhythm.     Heart sounds: Normal heart sounds. No murmur heard.  No friction rub. No gallop.   Pulmonary:     Effort: Pulmonary effort is normal. No respiratory distress.     Breath sounds: Normal breath sounds. No wheezing or rales.  Abdominal:     General: There is no distension.     Palpations: Abdomen is soft.     Tenderness: There is no abdominal tenderness. There is no guarding.  Musculoskeletal:        General: No tenderness.     Cervical back: Normal range of motion.  Skin:    General: Skin is warm and dry.     Findings: No erythema or rash.  Neurological:     Mental Status: She is alert.     Comments: appers to have bilateral weakness, initially will not lift right side but then able to participate on exam with encouragement. Initially appears aphasic but then able to answer questions     ED Results / Procedures / Treatments   Labs (all labs ordered are listed, but only abnormal results are displayed) Labs Reviewed  PROTIME-INR - Abnormal; Notable for the following components:      Result Value   Prothrombin Time 17.6 (*)    INR 1.5 (*)     All other components within normal limits  APTT - Abnormal; Notable for the following components:   aPTT 37 (*)    All other components within normal limits  CBC - Abnormal; Notable for the following components:   RDW 15.9 (*)    All other components within normal limits  COMPREHENSIVE METABOLIC PANEL - Abnormal; Notable for the following components:   Chloride 91 (*)    Glucose, Bld 231 (*)    Creatinine, Ser 5.11 (*)    Calcium 10.4 (*)    AST 14 (*)  Alkaline Phosphatase 146 (*)    GFR calc non Af Amer 9 (*)    GFR calc Af Amer 10 (*)    Anion gap 17 (*)    All other components within normal limits  PROTIME-INR - Abnormal; Notable for the following components:   Prothrombin Time 17.8 (*)    INR 1.5 (*)    All other components within normal limits  I-STAT CHEM 8, ED - Abnormal; Notable for the following components:   Chloride 93 (*)    Creatinine, Ser 5.00 (*)    Glucose, Bld 216 (*)    All other components within normal limits  CBG MONITORING, ED - Abnormal; Notable for the following components:   Glucose-Capillary 200 (*)    All other components within normal limits  DIFFERENTIAL  APTT    EKG None  Radiology EEG  Result Date: 05/13/2020 Lora Havens, MD     05/13/2020  5:29 PM Patient Name: [name] MRN: [id number] Epilepsy Attending: Lora Havens Referring Physician/Provider: Etta Quill, PA Date: 05/13/2020 Duration: 25.01 mins Patient history: 54yo F wit 3 minute unresponsive episode in dialysis, followed by concern for R sided weakness. EEG to evaluate for seizure. Level of alertness: Awake AEDs during EEG study: None Technical aspects: This EEG study was done with scalp electrodes positioned according to the 10-20 International system of electrode placement. Electrical activity was acquired at a sampling rate of 500Hz  and reviewed with a high frequency filter of 70Hz  and a low frequency filter of 1Hz . EEG data were recorded continuously and digitally  stored. Description: The posterior dominant rhythm consists of 8 Hz activity of moderate voltage (25-35 uV) seen predominantly in posterior head regions, symmetric and reactive to eye opening and eye closing. Hyperventilation and photic stimulation were not performed.   IMPRESSION: This study is within normal limits. No seizures or epileptiform discharges were seen throughout the recording. Lora Havens   MR BRAIN WO CONTRAST  Result Date: 05/13/2020 CLINICAL DATA:  Code stroke follow-up EXAM: MRI HEAD WITHOUT CONTRAST TECHNIQUE: Multiplanar, multiecho pulse sequences of the brain and surrounding structures were obtained without intravenous contrast. COMPARISON:  None. FINDINGS: Motion artifact is present. Brain: There is no acute infarction or intracranial hemorrhage. There are chronic infarcts of the corona radiata bilaterally, left basal ganglia, pons, and bilateral thalami. Chronic blood products are associated with the left basal ganglia infarct. Additional patchy T2 hyperintensity in the supratentorial and pontine white matter is nonspecific but may reflect mild chronic microvascular ischemic changes. Prominence of the ventricles and sulci reflects minor parenchymal volume loss. There is no intracranial mass, mass effect, or edema. There is no hydrocephalus or extra-axial fluid collection. Vascular: Major vessel flow voids at the skull base are preserved. Skull and upper cervical spine: Normal marrow signal is preserved. Sinuses/Orbits: Paranasal sinuses are aerated. Orbits are unremarkable. Other: Sella is unremarkable.  Mastoid air cells are clear. IMPRESSION: Motion degraded.  No acute infarction. Multiple chronic small vessel infarcts. Electronically Signed   By: Macy Mis M.D.   On: 05/13/2020 15:25   CT HEAD CODE STROKE WO CONTRAST  Result Date: 05/13/2020 CLINICAL DATA:  Code stroke. EXAM: CT HEAD WITHOUT CONTRAST TECHNIQUE: Contiguous axial images were obtained from the base of the skull  through the vertex without intravenous contrast. COMPARISON:  04/25/2020 FINDINGS: Brain: No acute intracranial hemorrhage, mass effect, or edema. There is no new loss of gray-white differentiation. There are chronic small vessel infarcts of the corona radiata bilaterally. Additional patchy  hypoattenuation in the supratentorial white matter likely reflects stable chronic microvascular ischemic changes. Ventricles are stable in size. Vascular: No hyperdense vessel. There is intracranial atherosclerotic calcification at the skull base. Skull: Unremarkable. Sinuses/Orbits: No acute finding. Other: Mastoid air cells are clear. ASPECTS ([place] Stroke Program Early CT Score) - Ganglionic level infarction (caudate, lentiform nuclei, internal capsule, insula, M1-M3 cortex): 7 - Supraganglionic infarction (M4-M6 cortex): 3 Total score (0-10 with 10 being normal): 10 IMPRESSION: No acute intracranial hemorrhage or evidence of acute infarction. ASPECT score is 10. Stable chronic findings detailed above. These results were communicated to Dr. Lorrin Goodell At 1:51 pmon 9/3/2021by text page via the [facility] messaging system. Electronically Signed   By: Macy Mis M.D.   On: 05/13/2020 13:56    Procedures Procedures (including critical care time)  Medications Ordered in ED Medications  sodium chloride flush (NS) 0.9 % injection 3 mL (has no administration in time range)  iohexol (OMNIPAQUE) 350 MG/ML injection 100 mL (has no administration in time range)  LORazepam (ATIVAN) injection 1 mg (has no administration in time range)    ED Course  I have reviewed the triage vital signs and the nursing notes.  Pertinent labs & imaging results that were available during my care of the patient were reviewed by me and considered in my medical decision making (see chart for details).    MDM Rules/Calculators/A&P                          54yo female with history of DM, hypertension, ESRD on dialysis who presents as a code  stroke from dialysis. Neurology at bedside on arrival.  CT head without acute findings. Neurology recommending STAT MRI.  MRI without acute CVA>  Pt signed out to Dr.Bero with reevaluation pending.    Final Clinical Impression(s) / ED Diagnoses Final diagnoses:  Transient disorientation    Rx / DC Orders ED Discharge Orders    None       Gareth Morgan, MD 05/13/20 2220

## 2020-05-13 NOTE — ED Notes (Signed)
Attempted to call report to nursing facility x2. No answer.

## 2021-02-04 IMAGING — CT CT HEAD CODE STROKE
3 series · 14 of 47 positions shown, 16 images · non-contrast
Comparison: 04/25/2020

CLINICAL DATA: Code stroke.

EXAM:
CT HEAD WITHOUT CONTRAST
TECHNIQUE: Contiguous axial images were obtained from the base of the skull
through the vertex without intravenous contrast.

[Series 3: head 5.0 st · axial · 0.43mm/px · z∈[-163,-38]mm · 8 of 30 slices shown, 10 images]
[im 3/30  brain]
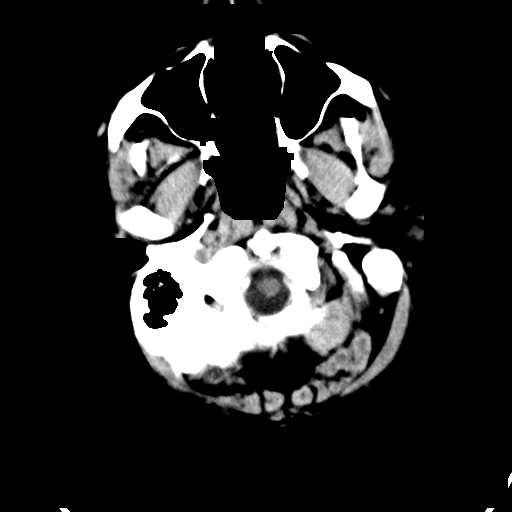
[im 3/30  bone]
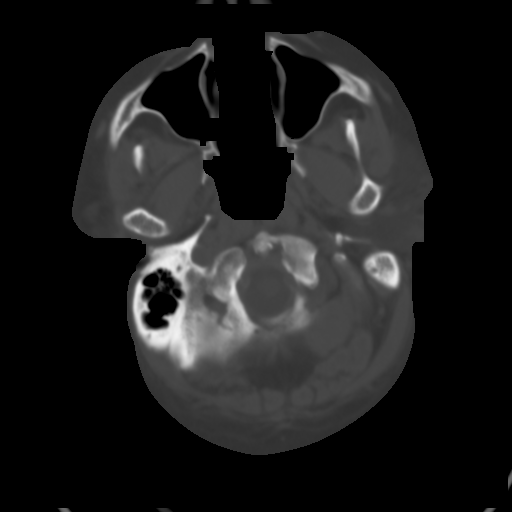
[im 7/30  brain]
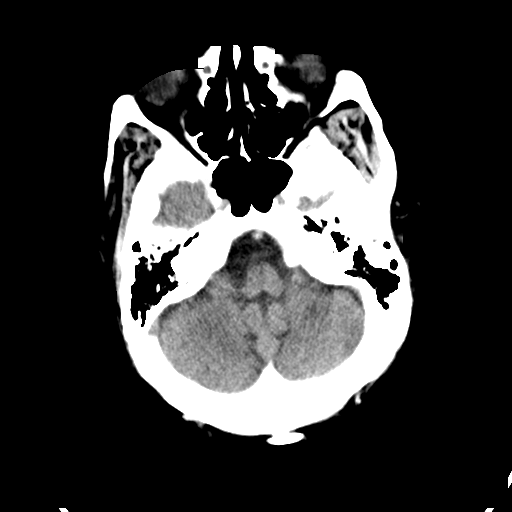
[im 10/30  brain]
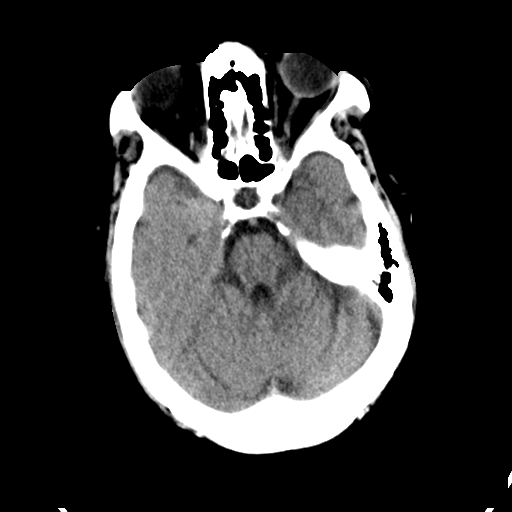
[im 14/30  brain]
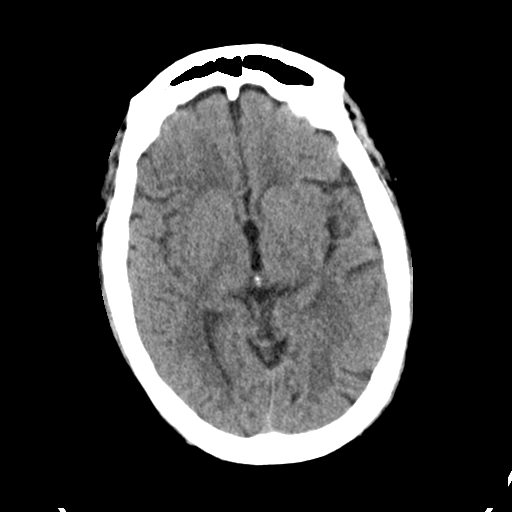
[im 17/30  brain]
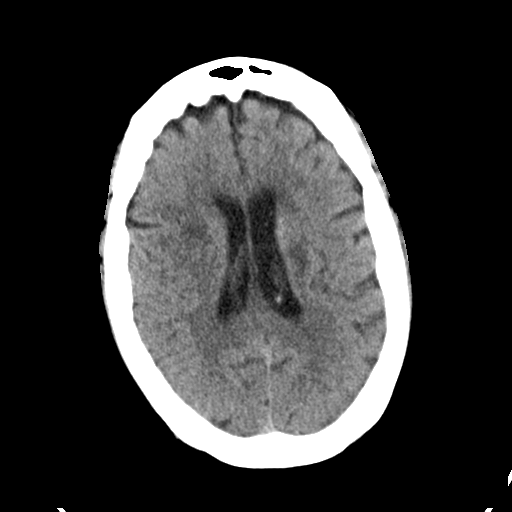
[im 17/30  bone]
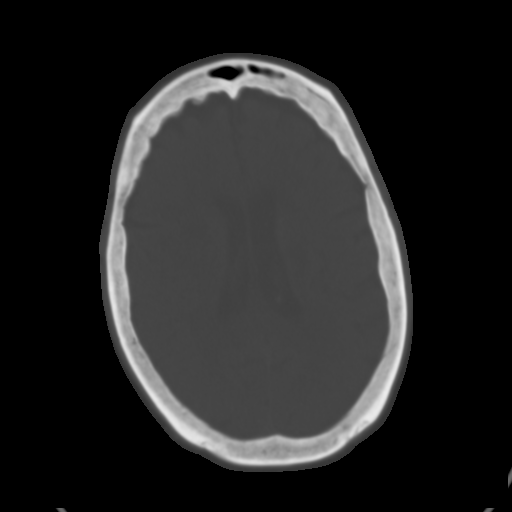
[im 21/30  brain]
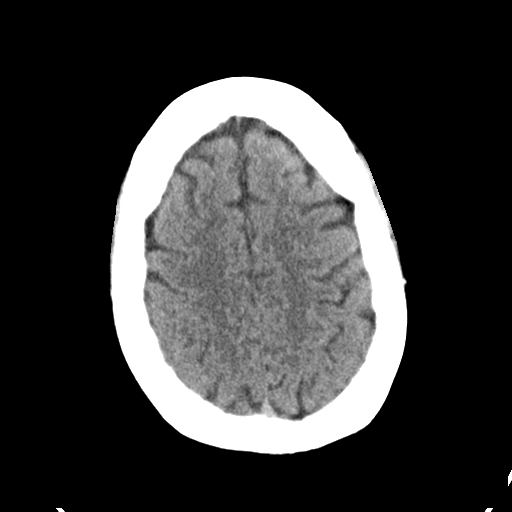
[im 24/30  brain]
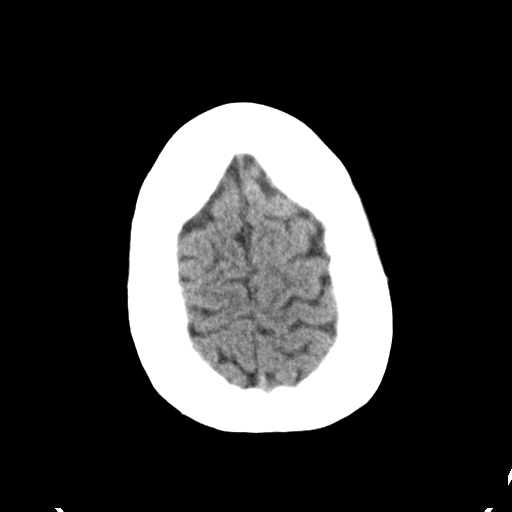
[im 28/30  brain]
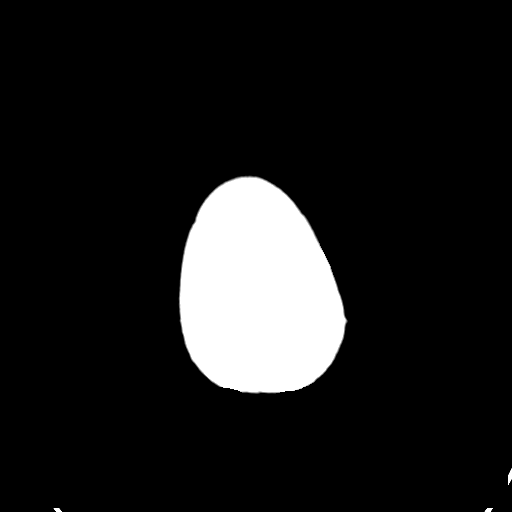

[Series 5: head 3.0 cor st · coronal · 0.40mm/px · 3 of 70 slices shown]
[im 24/70  brain]
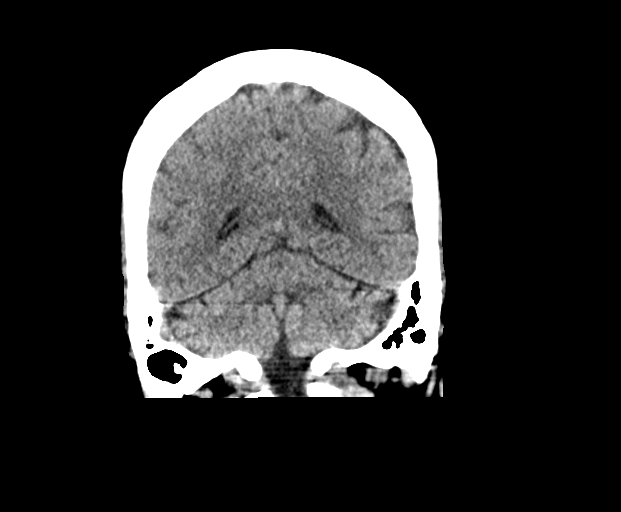
[im 31/70  brain]
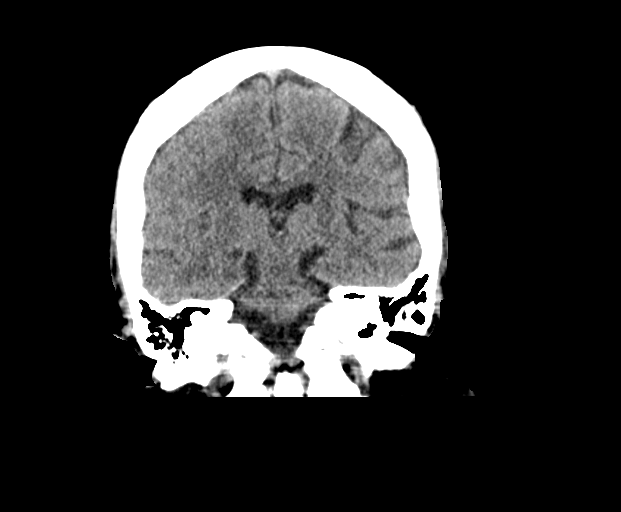
[im 39/70  brain]
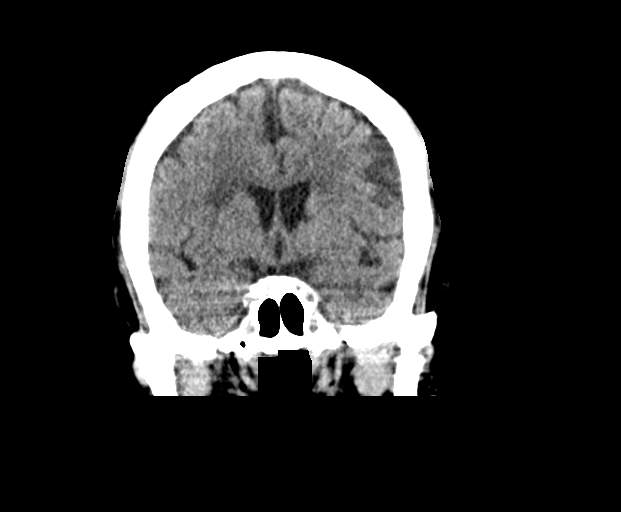

[Series 6: head 3.0 sag st · sagittal · 0.32mm/px · 3 of 64 slices shown]
[im 22/64  brain]
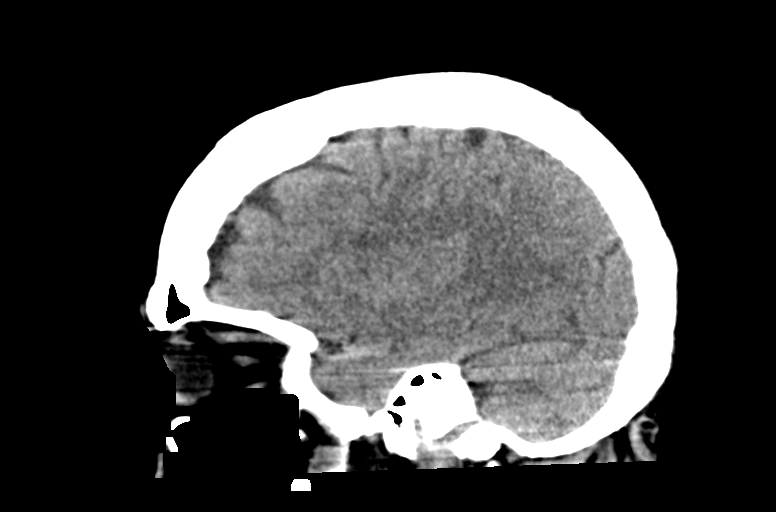
[im 32/64  brain]
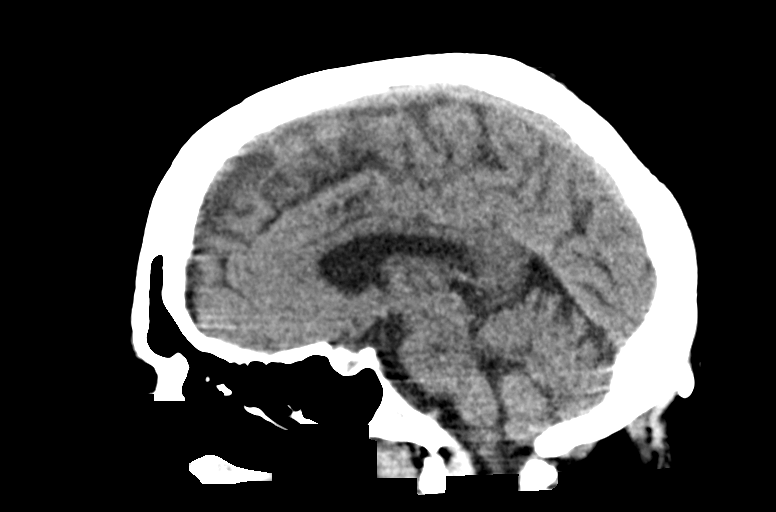
[im 42/64  brain]
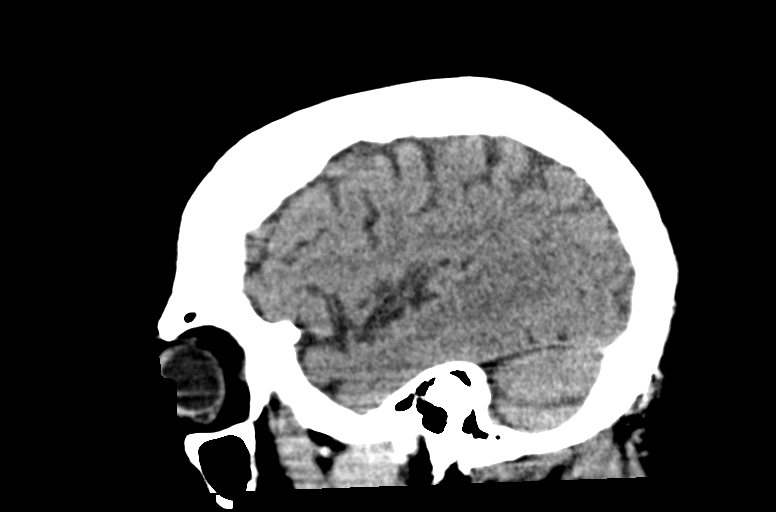

[14 of 47 positions shown; findings below may reference images not displayed]

FINDINGS: Brain: No acute intracranial hemorrhage, mass effect, or edema.
There is no new loss of gray-white differentiation. There are
chronic small vessel infarcts of the corona radiata bilaterally.
Additional patchy hypoattenuation in the supratentorial white matter
likely reflects stable chronic microvascular ischemic changes.
Ventricles are stable in size.

Vascular: No hyperdense vessel. There is intracranial
atherosclerotic calcification at the skull base.

Skull: Unremarkable.

Sinuses/Orbits: No acute finding.

Other: Mastoid air cells are clear.

ASPECTS ([place] Stroke Program Early CT Score)

- Ganglionic level infarction (caudate, lentiform nuclei, internal
capsule, insula, M1-M3 cortex): 7

- Supraganglionic infarction (M4-M6 cortex): 3

Total score (0-10 with 10 being normal): 10
IMPRESSION: No acute intracranial hemorrhage or evidence of acute infarction.
ASPECT score is 10.

Stable chronic findings detailed above.

These results were communicated to Dr. Ebadat At [DATE] pmon
05/13/2020by text page via the [employer] messaging system.
